# Patient Record
Sex: Female | Born: 1975 | Race: White | Hispanic: No | Marital: Single | Smoking: Never smoker
Health system: Southern US, Community
[De-identification: ages and names within clinical notes are randomized; demographics above are authoritative.]

## PROBLEM LIST (undated history)

## (undated) DIAGNOSIS — M549 Dorsalgia, unspecified: Secondary | ICD-10-CM

## (undated) DIAGNOSIS — F259 Schizoaffective disorder, unspecified: Secondary | ICD-10-CM

## (undated) DIAGNOSIS — J309 Allergic rhinitis, unspecified: Secondary | ICD-10-CM

## (undated) DIAGNOSIS — D510 Vitamin B12 deficiency anemia due to intrinsic factor deficiency: Secondary | ICD-10-CM

## (undated) HISTORY — DX: Allergic rhinitis, unspecified: J30.9

## (undated) HISTORY — DX: Schizoaffective disorder, unspecified: F25.9

## (undated) HISTORY — DX: Dorsalgia, unspecified: M54.9

## (undated) HISTORY — DX: Vitamin B12 deficiency anemia due to intrinsic factor deficiency: D51.0

---

## 2010-07-15 HISTORY — PX: CHOLECYSTECTOMY: SHX55

## 2014-05-09 ENCOUNTER — Ambulatory Visit: Payer: Self-pay | Admitting: General Practice

## 2015-03-16 ENCOUNTER — Ambulatory Visit (INDEPENDENT_AMBULATORY_CARE_PROVIDER_SITE_OTHER): Payer: PRIVATE HEALTH INSURANCE | Admitting: Obstetrics and Gynecology

## 2015-03-16 ENCOUNTER — Encounter: Payer: Self-pay | Admitting: Obstetrics and Gynecology

## 2015-03-16 VITALS — BP 127/81 | HR 77 | Ht 66.0 in | Wt 291.8 lb

## 2015-03-16 DIAGNOSIS — Z8742 Personal history of other diseases of the female genital tract: Secondary | ICD-10-CM | POA: Diagnosis not present

## 2015-03-16 DIAGNOSIS — R350 Frequency of micturition: Secondary | ICD-10-CM | POA: Diagnosis not present

## 2015-03-16 DIAGNOSIS — Z01419 Encounter for gynecological examination (general) (routine) without abnormal findings: Secondary | ICD-10-CM

## 2015-03-16 DIAGNOSIS — E669 Obesity, unspecified: Secondary | ICD-10-CM | POA: Diagnosis not present

## 2015-03-16 LAB — POCT URINALYSIS DIPSTICK
BILIRUBIN UA: NEGATIVE
Blood, UA: NEGATIVE
Glucose, UA: NEGATIVE
KETONES UA: NEGATIVE
LEUKOCYTES UA: NEGATIVE
Nitrite, UA: NEGATIVE
PH UA: 6
Protein, UA: NEGATIVE
SPEC GRAV UA: 1.01
Urobilinogen, UA: NEGATIVE

## 2015-03-16 MED ORDER — NORETHIN-ETH ESTRAD TRIPHASIC 0.5/0.75/1-35 MG-MCG PO TABS: 1.0000 | ORAL_TABLET | Freq: Every day | ORAL | Status: DC

## 2015-03-16 NOTE — Progress Notes (Signed)
Patient ID: Krystal Acosta, female   DOB: [birth year], 39 y.o.   MRN: [id number] ANNUAL PREVENTATIVE CARE GYN  ENCOUNTER NOTE  Subjective:       Krystal Acosta is a 39 y.o. G0P0000 female here for a routine annual gynecologic exam.  Current complaints: 1.  Refill pills  Needs pcp     Gynecologic History Menarche: Age 70. Cycles: Regular, monthly, lasting 5 days. History of dysmenorrhea requiring 8.  Advil a day (when not taking OCPs); no symptoms on OCPs. History of one abnormal Pap smear; follow-up Pap normal. No history of PID or STI Patient's last menstrual period was 02/28/2015 (exact date). Contraception: OCP (estrogen/progesterone) Last Pap: 2011. Results were: normal Last mammogram: 2012 Results were: normal  Obstetric History OB History  Gravida Para Term Preterm AB SAB TAB Ectopic Multiple Living  0 0 0 0 0 0 0 0 0 0         Past Medical History  Diagnosis Date  . Pernicious anemia   . AR (allergic rhinitis)   . Back pain     Past Surgical History  Procedure Laterality Date  . Cholecystectomy  2012    No current outpatient prescriptions on file prior to visit.   No current facility-administered medications on file prior to visit.    Allergies  Allergen Reactions  . Hydrocodone-Acetaminophen Itching    Social History   Social History  . Marital Status: Single    Spouse Name: N/A  . Number of Children: N/A  . Years of Education: N/A   Occupational History  . Not on file.   Social History Main Topics  . Smoking status: Never Smoker   . Smokeless tobacco: Not on file  . Alcohol Use: No  . Drug Use: No  . Sexual Activity: Yes    Birth Control/ Protection: Pill   Other Topics Concern  . Not on file   Social History Narrative  . No narrative on file    Family History  Problem Relation Age of Onset  . Diabetes Mother   . Heart disease Father   . Cancer Neg Hx     The following portions of the patient's history were reviewed and updated  as appropriate: allergies, current medications, past family history, past medical history, past social history, past surgical history and problem list.  Review of Systems ROS Review of Systems - General ROS: negative for - chills, fatigue, fever, hot flashes, night sweats, weight gain or weight loss Psychological ROS: negative for - anxiety, decreased libido, depression, mood swings, physical abuse or sexual abuse Ophthalmic ROS: negative for - blurry vision, eye pain or loss of vision ENT ROS: negative for - headaches, hearing change, visual changes or vocal changes Allergy and Immunology ROS: negative for - hives, itchy/watery eyes or seasonal allergies Hematological and Lymphatic ROS: negative for - bleeding problems, bruising, swollen lymph nodes or weight loss Endocrine ROS: negative for - galactorrhea, hair pattern changes, hot flashes, malaise/lethargy, mood swings, palpitations, polydipsia/polyuria, skin changes, temperature intolerance or unexpected weight changes Breast ROS: negative for - new or changing breast lumps or nipple discharge Respiratory ROS: negative for - cough or shortness of breath Cardiovascular ROS: negative for - chest pain, irregular heartbeat, palpitations or shortness of breath Gastrointestinal ROS: no abdominal pain, change in bowel habits, or black or bloody stools Genito-Urinary ROS: no dysuria, trouble voiding, or hematuria Musculoskeletal ROS: negative for - joint pain or joint stiffness Neurological ROS: negative for - bowel and bladder control changes Dermatological ROS: negative  for rash and skin lesion changes   Objective:   BP 127/81 mmHg  Pulse 77  Ht  (1.676 m)  Wt 291 lb 12.8 oz (132.36 kg)  BMI 47.12 kg/m2  LMP 02/28/2015 (Exact Date) CONSTITUTIONAL: Well-developed, well-nourished female in no acute distress.  PSYCHIATRIC: Normal mood and affect. Normal behavior. Normal judgment and thought content. NEUROLGIC: Alert and oriented to  person, place, and time. Normal muscle tone coordination. No cranial nerve deficit noted. HENT:  Normocephalic, atraumatic, External right and left ear normal. Oropharynx is clear and moist EYES: Conjunctivae and EOM are normal. Pupils are equal, round, and reactive to light. No scleral icterus.  NECK: Normal range of motion, supple, no masses.  Normal thyroid.  SKIN: Skin is warm and dry. No rash noted. Not diaphoretic. No erythema. No pallor. CARDIOVASCULAR: Normal heart rate noted, regular rhythm, no murmur. RESPIRATORY: Clear to auscultation bilaterally. Effort and breath sounds normal, no problems with respiration noted. BREASTS: Symmetric in size. No masses, skin changes, nipple drainage, or lymphadenopathy. ABDOMEN: Soft, normal bowel sounds, no distention noted.  No tenderness, rebound or guarding.  BLADDER: Normal PELVIC:  External Genitalia: Normal  BUS: Normal  Vagina: Normal  Cervix: Normal; No lesions  Uterus: Normal; Midplane, normal size, shape, mobile  Adnexa: Normal; Nonpalpable, nontender  RV: External Exam NormaI  MUSCULOSKELETAL: Normal range of motion. No tenderness.  No cyanosis, clubbing, or edema.  2+ distal pulses. LYMPHATIC: No Axillary, Supraclavicular, or Inguinal Adenopathy.    Assessment:   Annual gynecologic examination 39 y.o. Contraception: OCP (estrogen/progesterone) Obesity 1 Problem List Items Addressed This Visit    None      Plan:  Pap: Pap Co Test Mammogram: Not Indicated Stool Guaiac Testing:  Not Indicated Labs: empoyee clinic Routine preventative health maintenance measures emphasized: Exercise/Diet/Weight control, Tobacco Warnings and Alcohol/Substance use risks Refill OCPs, (Nortrel 777) Return to Clinic - 1 [address] [place], CMA  Herold Harms, MD

## 2015-03-20 DIAGNOSIS — F259 Schizoaffective disorder, unspecified: Secondary | ICD-10-CM | POA: Insufficient documentation

## 2015-03-20 DIAGNOSIS — G44209 Tension-type headache, unspecified, not intractable: Secondary | ICD-10-CM | POA: Insufficient documentation

## 2015-03-20 DIAGNOSIS — N6019 Diffuse cystic mastopathy of unspecified breast: Secondary | ICD-10-CM | POA: Insufficient documentation

## 2015-03-20 DIAGNOSIS — F32A Depression, unspecified: Secondary | ICD-10-CM | POA: Insufficient documentation

## 2015-03-20 DIAGNOSIS — D510 Vitamin B12 deficiency anemia due to intrinsic factor deficiency: Secondary | ICD-10-CM | POA: Insufficient documentation

## 2015-03-20 DIAGNOSIS — IMO0001 Reserved for inherently not codable concepts without codable children: Secondary | ICD-10-CM | POA: Insufficient documentation

## 2015-03-20 DIAGNOSIS — F329 Major depressive disorder, single episode, unspecified: Secondary | ICD-10-CM | POA: Insufficient documentation

## 2015-03-20 NOTE — Patient Instructions (Signed)
1.  Pap smear taken. 2.  Nortrel 7/7/7 refills. 3.  Healthy eating and exercise encouraged. 4.  Return in one year for annual exam.

## 2015-03-21 LAB — PAP IG AND HPV HIGH-RISK: PAP Smear Comment: 0

## 2015-05-16 ENCOUNTER — Encounter: Payer: Self-pay | Admitting: Physician Assistant

## 2015-05-16 ENCOUNTER — Ambulatory Visit: Payer: Self-pay | Admitting: Physician Assistant

## 2015-05-16 VITALS — BP 120/80 | HR 78 | Temp 98.7°F

## 2015-05-16 DIAGNOSIS — J209 Acute bronchitis, unspecified: Secondary | ICD-10-CM

## 2015-05-16 MED ORDER — AZITHROMYCIN 250 MG PO TABS: ORAL_TABLET | ORAL | Status: DC

## 2015-05-16 MED ORDER — ALBUTEROL SULFATE HFA 108 (90 BASE) MCG/ACT IN AERS: 2.0000 | INHALATION_SPRAY | Freq: Four times a day (QID) | RESPIRATORY_TRACT | Status: DC | PRN

## 2015-05-16 MED ORDER — HYDROCOD POLST-CPM POLST ER 10-8 MG/5ML PO SUER: 5.0000 mL | Freq: Two times a day (BID) | ORAL | Status: DC | PRN

## 2015-05-16 NOTE — Progress Notes (Signed)
S: C/o cough and congestion, chest is sore from coughing, ? fever, chills, cough is dry and hacking; keeping pt awake at night;  denies cardiac type chest pain or sob, v/d, abd pain, having night sweats for last 3 nights Remainder ros neg  O: vitals wnl, nad, tms clear, throat injected, neck supple no lymph, lungs c t a, cv rrr, neuro intact, cough is dry and hacking  A:  Acute bronchitis   P:  rx medication: zpack, albuterol inhaler, tussionex (pt has had before and did ok) ; use otc meds, tylenol or motrin as needed for fever/chills, return if not better in 3 -5 days, return earlier if worsening

## 2015-05-23 ENCOUNTER — Encounter: Payer: Self-pay | Admitting: Physician Assistant

## 2015-05-23 ENCOUNTER — Ambulatory Visit: Payer: Self-pay | Admitting: Family

## 2015-05-23 VITALS — BP 120/90 | HR 97 | Temp 99.5°F

## 2015-05-23 DIAGNOSIS — J329 Chronic sinusitis, unspecified: Secondary | ICD-10-CM

## 2015-05-23 MED ORDER — LEVOFLOXACIN 500 MG PO TABS: 500.0000 mg | ORAL_TABLET | Freq: Every day | ORAL | Status: DC

## 2015-05-23 NOTE — Progress Notes (Signed)
S/ RTC c/o persistant facial pain and pressure after completing zpack, dry cough , blowing green malaise and fever, states her sugars are normal, back sore from coughing   O/ low grade temp, mildly ill appearing , NAD dry cough ENT R tm red superiorly , nasal mucosa red, turbinates boggy with purulent d/c + frontomax tenderness , pharynx increased pnd neck supple heart rsr lungs clear A/ sinusitis P levaquin 500 mg  X 10 days one qd.supportive measures nasal saline. Tylenol 1-2 tabs po q4h prn For fever control .

## 2015-07-27 ENCOUNTER — Ambulatory Visit: Payer: Self-pay | Admitting: Physician Assistant

## 2015-07-27 ENCOUNTER — Encounter: Payer: Self-pay | Admitting: Physician Assistant

## 2015-07-27 VITALS — BP 140/80 | HR 99 | Temp 99.4°F

## 2015-07-27 DIAGNOSIS — J018 Other acute sinusitis: Secondary | ICD-10-CM

## 2015-07-27 MED ORDER — AMOXICILLIN-POT CLAVULANATE 875-125 MG PO TABS: 1.0000 | ORAL_TABLET | Freq: Two times a day (BID) | ORAL | Status: DC

## 2015-07-27 NOTE — Progress Notes (Signed)
S: c/o ear and neck pain, ? r side of neck has been sore for over a month; Fever on and off, no cough or congestion, no cp/sob; also having chronic low back and hip pain, has seen her chiropractor and pcp, pcp sent her to PT and it didn't help, doesn't want to f/u with ortho as suggested  O: vitals wnl, nad, tms dull, nasal mucosa red and swollen, throat wnl, neck supple no lymph, tender on r side under mandible but adjacent to cervical chain; lungs c t a, cv rrr, back nontender, pt has trouble standing due to "stiffness"; able to walk without a limp or foot drop  A: acute sinusitis, chronic low back and hip pain  P: augmentin 875mg  bid x 10d, will refer to Ent; told her to see pcp about her back since doesn't want a referal to ortho

## 2015-08-23 ENCOUNTER — Other Ambulatory Visit: Payer: Self-pay

## 2015-08-23 DIAGNOSIS — Z79899 Other long term (current) drug therapy: Secondary | ICD-10-CM

## 2015-08-23 DIAGNOSIS — F259 Schizoaffective disorder, unspecified: Secondary | ICD-10-CM

## 2015-08-23 NOTE — Progress Notes (Signed)
Patient in office today for labs only per Dr Wellington Hampshire drawn from right arm

## 2015-08-24 LAB — CMP12+LP+TP+TSH+6AC+CBC/D/PLT
ALK PHOS: 67 IU/L (ref 39–117)
ALT: 10 IU/L (ref 0–32)
AST: 7 IU/L (ref 0–40)
Albumin/Globulin Ratio: 1.5 (ref 1.1–2.5)
Albumin: 4.1 g/dL (ref 3.5–5.5)
BASOS: 0 %
BILIRUBIN TOTAL: 0.2 mg/dL (ref 0.0–1.2)
BUN / CREAT RATIO: 11 (ref 8–20)
BUN: 9 mg/dL (ref 6–20)
Basophils Absolute: 0 10*3/uL (ref 0.0–0.2)
CALCIUM: 9.3 mg/dL (ref 8.7–10.2)
CHLORIDE: 100 mmol/L (ref 96–106)
CHOL/HDL RATIO: 4.5 ratio — AB (ref 0.0–4.4)
CHOLESTEROL TOTAL: 199 mg/dL (ref 100–199)
Creatinine, Ser: 0.83 mg/dL (ref 0.57–1.00)
EOS (ABSOLUTE): 0.4 10*3/uL (ref 0.0–0.4)
EOS: 2 %
ESTIMATED CHD RISK: 1.1 times avg. — AB (ref 0.0–1.0)
FREE THYROXINE INDEX: 1.7 (ref 1.2–4.9)
GFR calc Af Amer: 104 mL/min/{1.73_m2} (ref >59)
GFR calc non Af Amer: 90 mL/min/{1.73_m2} (ref >59)
GGT: 14 IU/L (ref 0–60)
GLUCOSE: 81 mg/dL (ref 65–99)
Globulin, Total: 2.8 g/dL (ref 1.5–4.5)
HDL: 44 mg/dL (ref >39)
HEMATOCRIT: 39.3 % (ref 34.0–46.6)
HEMOGLOBIN: 13 g/dL (ref 11.1–15.9)
IMMATURE GRANS (ABS): 0.1 10*3/uL (ref 0.0–0.1)
IMMATURE GRANULOCYTES: 1 %
Iron: 91 ug/dL (ref 27–159)
LDH: 117 IU/L — AB (ref 119–226)
LDL CALC: 108 mg/dL — AB (ref 0–99)
LYMPHS ABS: 4.8 10*3/uL — AB (ref 0.7–3.1)
Lymphs: 26 %
MCH: 28.4 pg (ref 26.6–33.0)
MCHC: 33.1 g/dL (ref 31.5–35.7)
MCV: 86 fL (ref 79–97)
Monocytes Absolute: 1.1 10*3/uL — ABNORMAL HIGH (ref 0.1–0.9)
Monocytes: 6 %
NEUTROS ABS: 12 10*3/uL — AB (ref 1.4–7.0)
NEUTROS PCT: 65 %
Phosphorus: 3.4 mg/dL (ref 2.5–4.5)
Platelets: 328 10*3/uL (ref 150–379)
Potassium: 4.7 mmol/L (ref 3.5–5.2)
RBC: 4.58 x10E6/uL (ref 3.77–5.28)
RDW: 13.9 % (ref 12.3–15.4)
SODIUM: 139 mmol/L (ref 134–144)
T3 Uptake Ratio: 24 % (ref 24–39)
T4, Total: 7 ug/dL (ref 4.5–12.0)
TSH: 4.81 u[IU]/mL — ABNORMAL HIGH (ref 0.450–4.500)
Total Protein: 6.9 g/dL (ref 6.0–8.5)
Triglycerides: 235 mg/dL — ABNORMAL HIGH (ref 0–149)
URIC ACID: 4.3 mg/dL (ref 2.5–7.1)
VLDL CHOLESTEROL CAL: 47 mg/dL — AB (ref 5–40)
WBC: 18.5 10*3/uL — ABNORMAL HIGH (ref 3.4–10.8)

## 2015-08-24 LAB — LITHIUM LEVEL: Lithium Lvl: 0.6 mmol/L (ref 0.6–1.4)

## 2015-08-24 LAB — HGB A1C W/O EAG: HEMOGLOBIN A1C: 5.7 % — AB (ref 4.8–5.6)

## 2015-08-24 NOTE — Progress Notes (Signed)
Lab results was faxed to Dr. Quintella Reichert at [facility] per patient's request.

## 2015-11-08 ENCOUNTER — Ambulatory Visit: Payer: Self-pay | Admitting: Physician Assistant

## 2015-11-08 ENCOUNTER — Encounter: Payer: Self-pay | Admitting: Physician Assistant

## 2015-11-08 DIAGNOSIS — R21 Rash and other nonspecific skin eruption: Secondary | ICD-10-CM

## 2015-11-08 DIAGNOSIS — Z299 Encounter for prophylactic measures, unspecified: Secondary | ICD-10-CM

## 2015-11-08 NOTE — Progress Notes (Signed)
S: needs referal for rash on face, skin texture has changed, is rough and dry, darkened, ? If from birth control pills  O: skin with red area on cheeks and brown area typical of hormonal rash, no pustules or drainage, n/v intact  A: rash  P: refer to dermatology

## 2015-11-08 NOTE — Addendum Note (Signed)
Addended by: Catha BrowEACON, MONIQUE T on: 11/08/2015 10:14 AM   Modules accepted: Orders

## 2015-11-11 LAB — ALLERGEN PANEL (27) + IGE
Aspergillus Fumigatus IgE: <0.1 kU/L
Bahia Grass IgE: <0.1 kU/L
Bermuda Grass IgE: <0.1 kU/L
Cat Dander IgE: 0.2 kU/L — AB
Cedar, Mountain IgE: <0.1 kU/L
Cladosporium Herbarum IgE: <0.1 kU/L
Common Silver Birch IgE: <0.1 kU/L
D Farinae IgE: <0.1 kU/L
Dog Dander IgE: <0.1 kU/L
Elm, American IgE: <0.1 kU/L
Hickory, White IgE: <0.1 kU/L
IGE (IMMUNOGLOBULIN E), SERUM: 17 [IU]/mL (ref 0–100)
Johnson Grass IgE: <0.1 kU/L
Kentucky Bluegrass IgE: <0.1 kU/L
Maple/Box Elder IgE: <0.1 kU/L
Penicillium Chrysogen IgE: <0.1 kU/L
Timothy Grass IgE: <0.1 kU/L
White Mulberry IgE: <0.1 kU/L

## 2015-11-11 LAB — THYROID PANEL WITH TSH
FREE THYROXINE INDEX: 1.5 (ref 1.2–4.9)
T3 UPTAKE RATIO: 25 % (ref 24–39)
T4, Total: 5.8 ug/dL (ref 4.5–12.0)
TSH: 2.95 u[IU]/mL (ref 0.450–4.500)

## 2015-11-11 LAB — ANA W/REFLEX IF POSITIVE: Anti Nuclear Antibody(ANA): NEGATIVE

## 2015-11-11 LAB — LITHIUM LEVEL: LITHIUM LVL: 0.8 mmol/L (ref 0.6–1.2)

## 2015-11-11 LAB — VITAMIN B12: Vitamin B-12: 670 pg/mL (ref 211–946)

## 2015-11-17 ENCOUNTER — Encounter: Payer: Self-pay | Admitting: Physician Assistant

## 2015-11-17 ENCOUNTER — Ambulatory Visit: Payer: Self-pay | Admitting: Physician Assistant

## 2015-11-17 VITALS — BP 117/70 | HR 85 | Temp 99.3°F

## 2015-11-17 DIAGNOSIS — J0181 Other acute recurrent sinusitis: Secondary | ICD-10-CM

## 2015-11-17 MED ORDER — SULFAMETHOXAZOLE-TRIMETHOPRIM 800-160 MG PO TABS: 1.0000 | ORAL_TABLET | Freq: Two times a day (BID) | ORAL | Status: DC

## 2015-11-17 NOTE — Progress Notes (Signed)
S: C/o sore throat and sinus drainage for 3 days, no fever, chills, cp/sob, v/d;  Was seen by ENT after sinus infection in January and took about 4 weeks of antibiotics, finally got cleared up, was told ct showed a lot of packed infection  Using otc meds:   O: PE: perrl eomi, normocephalic, tms dull, nasal mucosa red and swollen, throat injected, neck supple no lymph, lungs c t a, cv rrr, neuro intact  A:  Acute sinusitis, recurrent   P: drink fluids, continue regular meds , use otc meds of choice, return if not improving in 5 days, return earlier if worsening ; septra ds 1 po bid, f/u with ENT

## 2016-01-26 ENCOUNTER — Ambulatory Visit: Payer: Self-pay | Admitting: Physician Assistant

## 2016-01-26 ENCOUNTER — Encounter: Payer: Self-pay | Admitting: Physician Assistant

## 2016-01-26 VITALS — BP 129/80 | HR 80 | Temp 99.0°F

## 2016-01-26 DIAGNOSIS — IMO0001 Reserved for inherently not codable concepts without codable children: Secondary | ICD-10-CM

## 2016-01-26 MED ORDER — KETOROLAC TROMETHAMINE 60 MG/2ML IM SOLN
60.0000 mg | Freq: Once | INTRAMUSCULAR | Status: AC
  Administered 2016-01-26: 60 mg via INTRAMUSCULAR

## 2016-01-26 MED ORDER — NAPROXEN 500 MG PO TABS: 500.0000 mg | ORAL_TABLET | Freq: Two times a day (BID) | ORAL | Status: DC

## 2016-01-26 MED ORDER — METHOCARBAMOL 750 MG PO TABS: 750.0000 mg | ORAL_TABLET | Freq: Four times a day (QID) | ORAL | Status: DC

## 2016-01-26 NOTE — Progress Notes (Signed)
   Subjective:Right hip pain    Patient ID: Nyoka LintChristy Darcey, female    DOB: May 06, 1976, 40 y.o.   MRN: [id number]  HPI  Patient c/o one month of right hip pain 2nd to martial art training. Denies radicular pain from this compliant. No relief with conservative treatment. States pain increase with attemping to stand after prolong sitting. Denies bladder or bowel dysfunction.   Review of Systems    DM,depression, schizo-affective disorder and amenia. Objective:   Physical Exam Morbid obesity. Normal gait. No leg length discrepancy. Increase guarding with hip flexion and abduction.       Assessment & Plan: Right hip strain  Five day trail of Tramadol, Robaxin, and Naproxen.Decreased physical training for 10 days. Follow up 5 days if no improvement.

## 2016-03-08 ENCOUNTER — Ambulatory Visit: Payer: Self-pay | Admitting: Physician Assistant

## 2016-03-08 DIAGNOSIS — F329 Major depressive disorder, single episode, unspecified: Secondary | ICD-10-CM

## 2016-03-08 DIAGNOSIS — F32A Depression, unspecified: Secondary | ICD-10-CM

## 2016-03-08 DIAGNOSIS — R5383 Other fatigue: Principal | ICD-10-CM

## 2016-03-08 MED ORDER — METHOCARBAMOL 750 MG PO TABS: 750.0000 mg | ORAL_TABLET | Freq: Four times a day (QID) | ORAL | 0 refills | Status: DC

## 2016-03-08 NOTE — Progress Notes (Signed)
   Subjective:    Patient ID: Krystal Acosta, female    DOB: 1975-08-14, 40 y.o.   MRN: [id number]  HPI Patient c/o  hair loss, neck pain, and ear ache for one week. Also c/o chronic fatigue and has notice hair loss from scalp. Patient states was on Synthroid but was discontinue by treating Doctor 4 months ago. States neck pain is left lateral and increase with right later motions of neck. Denies radicular pain. And Depression..   Review of Systems  Depression Objective:   Physical Exam No acute distress. HEENT unremarkable. Neck supple with left Nuchal muscle spasms. Lungs CTA and Heart RRR.       Assessment & Plan:Cervical Strain/Fatigue  TSH and T4 with follow up in 3 days. Robaxin for cervical strain.

## 2016-03-09 LAB — TSH+FREE T4
FREE T4: 0.78 ng/dL — AB (ref 0.82–1.77)
TSH: 4.17 u[IU]/mL (ref 0.450–4.500)

## 2016-03-11 ENCOUNTER — Ambulatory Visit: Payer: Self-pay | Admitting: Physician Assistant

## 2016-03-11 ENCOUNTER — Encounter: Payer: Self-pay | Admitting: Physician Assistant

## 2016-03-11 VITALS — BP 130/82 | HR 88 | Temp 99.4°F

## 2016-03-11 DIAGNOSIS — M26621 Arthralgia of right temporomandibular joint: Secondary | ICD-10-CM

## 2016-03-11 DIAGNOSIS — L659 Nonscarring hair loss, unspecified: Secondary | ICD-10-CM

## 2016-03-11 NOTE — Progress Notes (Signed)
S: here for recheck and lab results, states her hair is thinning on the top of her head, depression is worsening but has appointment with her psychiatrist on Wed, also her r jaw is sore and hurts to open, no fever, chills  O: vitals wnl, nad, scalp with a small area of thinning hair on top, r tmj is tender, pt is able to open and close her mouth, lungs c t a, cv rrr  A: tmj, depression  P: tsh lab printed off for psychiatry, pt to f/u on Wed, is not suicidal, continue counseling at [facility]

## 2016-03-15 NOTE — Progress Notes (Signed)
ANNUAL PREVENTATIVE CARE GYN  ENCOUNTER NOTE  Subjective:       Krystal Acosta is a 40 y.o. G0P0000 female here for a routine annual gynecologic exam.  Current complaints: 1.   Irregular bleeding on ocp   Hot flashes   Thinning hair  Patient has gained weight this year. Activity is diminished since she has an injury to her right hip doing martial arts exercises. 11/08/2015 lab work is notable for a normal thyroid profile including TSH of 2.95, total T4 of 5.8, FTI 1.5. Antenatal testing was negative. B-12 level was normal as well as a lithium level Lab work from 3 days ago demonstrated a borderline TSH of 4.85 and a slightly low T4; she was prescribed Synthroid 25 g a day and is to have follow-up with her primary care.  Patient is also complaining of some left axillary discomfort for the past several weeks. No recent history of trauma.  Menstrual cycles have been irregular the past 2 months with 2 weeks of bleeding of dark burgundy blood and slight increased cramping. She does have history of severe dysmenorrhea prior to start OCP therapy.     Gynecologic History Menarche: Age 85. Cycles: Regular, monthly, lasting 5 days. History of dysmenorrhea requiring 8.  Advil a day (when not taking OCPs); no symptoms on OCPs. History of one abnormal Pap smear; follow-up Pap normal. No history of PID or STI Patient's last menstrual period was 02/28/2015 (exact date). Contraception: OCP (estrogen/progesterone) Last Pap:  03/16/2015 negative/negative Last mammogram: 2012 Results were: normal  Obstetric History OB History  Gravida Para Term Preterm AB Living  0 0 0 0 0 0  SAB TAB Ectopic Multiple Live Births  0 0 0 0          Past Medical History:  Diagnosis Date  . AR (allergic rhinitis)   . Back pain   . Pernicious anemia   . Schizoaffective disorder [facility])     Past Surgical History:  Procedure Laterality Date  . CHOLECYSTECTOMY  2012    Current Outpatient Prescriptions on  File Prior to Visit  Medication Sig Dispense Refill  . albuterol (PROVENTIL HFA;VENTOLIN HFA) 108 (90 BASE) MCG/ACT inhaler Inhale 2 puffs into the lungs every 6 (six) hours as needed for wheezing or shortness of breath. 1 Inhaler 0  . cyanocobalamin (,VITAMIN B-12,) 1000 MCG/ML injection Inject as directed. monthly    . fluticasone (FLONASE) 50 MCG/ACT nasal spray 2 SPRAYS EACH NOSTRIL EVERY DAY  0  . IRON PO Take by mouth.    . lithium carbonate (LITHOBID) 300 MG CR tablet Take by mouth.    . metFORMIN (GLUCOPHAGE) 1000 MG tablet Take 1,000 mg by mouth.    . methocarbamol (ROBAXIN-750) 750 MG tablet Take 1 tablet (750 mg total) by mouth 4 (four) times daily. (Patient not taking: Reported on 03/11/2016) 20 tablet 0  . methocarbamol (ROBAXIN-750) 750 MG tablet Take 1 tablet (750 mg total) by mouth 4 (four) times daily. 20 tablet 0  . naproxen (NAPROSYN) 500 MG tablet Take 1 tablet (500 mg total) by mouth 2 (two) times daily with a meal. 20 tablet 00  . norethindrone-ethinyl estradiol (NORTREL 7/7/7) 0.5/0.75/1-35 MG-MCG tablet Take 1 tablet by mouth daily. 3 Package 4  . QUEtiapine (SEROQUEL) 200 MG tablet Take 400 mg by mouth.     . sertraline (ZOLOFT) 50 MG tablet Take 50 mg by mouth.     No current facility-administered medications on file prior to visit.     Allergies  Allergen Reactions  . Hydrocodone-Acetaminophen Itching    Social History   Social History  . Marital status: Single    Spouse name: N/A  . Number of children: N/A  . Years of education: N/A   Occupational History  . Not on file.   Social History Main Topics  . Smoking status: Never Smoker  . Smokeless tobacco: Not on file  . Alcohol use No  . Drug use: No  . Sexual activity: Yes    Birth control/ protection: Pill   Other Topics Concern  . Not on file   Social History Narrative  . No narrative on file    Family History  Problem Relation Age of Onset  . Diabetes Mother   . Heart disease Father   .  Cancer Neg Hx     The following portions of the patient's history were reviewed and updated as appropriate: allergies, current medications, past family history, past medical history, past social history, past surgical history and problem list.  Review of Systems ROS Review of Systems - General ROS: negative for - chills, fatigue, fever, hot flashes, night sweats, weight gain or weight loss Psychological ROS: negative for - anxiety, decreased libido, depression, mood swings, physical abuse or sexual abuse Ophthalmic ROS: negative for - blurry vision, eye pain or loss of vision ENT ROS: negative for - headaches, hearing change, visual changes or vocal changes Allergy and Immunology ROS: negative for - hives, itchy/watery eyes or seasonal allergies Hematological and Lymphatic ROS: negative for - bleeding problems, bruising, swollen lymph nodes or weight loss Endocrine ROS: negative for - galactorrhea, hair pattern changes, hot flashes, malaise/lethargy, mood swings, palpitations, polydipsia/polyuria, skin changes, temperature intolerance or unexpected weight changes Breast ROS: negative for - new or changing breast lumps or nipple discharge Respiratory ROS: negative for - cough or shortness of breath Cardiovascular ROS: negative for - chest pain, irregular heartbeat, palpitations or shortness of breath Gastrointestinal ROS: no abdominal pain, change in bowel habits, or black or bloody stools Genito-Urinary ROS: no dysuria, trouble voiding, or hematuria Musculoskeletal ROS: negative for - joint pain or joint stiffness; no axillary abnormalities identified on palpation Neurological ROS: negative for - bowel and bladder control changes Dermatological ROS: negative for rash and skin lesion changes   Objective:   BP 121/85   Pulse 85   Ht 5\' 6"  (1.676 m)   Wt (!) 301 lb 8 oz (136.8 kg)   LMP 03/11/2016 (Exact Date)   BMI 48.66 kg/m  CONSTITUTIONAL: Well-developed, well-nourished female in no  acute distress.  PSYCHIATRIC: Normal mood and affect. Normal behavior. Normal judgment and thought content. NEUROLGIC: Alert and oriented to person, place, and time. Normal muscle tone coordination. No cranial nerve deficit noted. HENT:  Normocephalic, atraumatic, External right and left ear normal.  EYES: Conjunctivae and EOM are normal.No scleral icterus.  NECK: Normal range of motion, supple, no masses.  Normal thyroid.  SKIN: Skin is warm and dry. No rash noted. Not diaphoretic. No erythema. No pallor. CARDIOVASCULAR: Normal heart rate noted, regular rhythm, no murmur. RESPIRATORY: Clear to auscultation bilaterally. Effort and breath sounds normal, no problems with respiration noted. BREASTS: Symmetric in size. No masses, skin changes, nipple drainage, or lymphadenopathy. ABDOMEN: Soft, normal bowel sounds, no distention noted.  No tenderness, rebound or guarding.  BLADDER: Normal PELVIC:                       External Genitalia: Normal  BUS: Normal                       Vagina: Normal; burgundy menstrual blood in vault                       Cervix: Normal; No lesions; no significant cervical motion tenderness                       Uterus: Normal; Midplane, normal size, shape, mobile, nontender                       Adnexa: Normal; Nonpalpable, nontender                       RV: External Exam NormaI  MUSCULOSKELETAL: Normal range of motion. No tenderness.  No cyanosis, clubbing, or edema.  2+ distal pulses. LYMPHATIC: No Axillary, Supraclavicular, or Inguinal Adenopathy.    Assessment:   Annual gynecologic examination 40 y.o. Contraception: OCP (estrogen/progesterone) Nortrel 7/7/7  Obesity 2 increased weight this year Problem List Items Addressed This Visit    None    Visit Diagnoses    Well woman exam with routine gynecological exam    -  Primary   History of dysmenorrhea         Recent history of abnormal uterine bleeding for 2 weeks while on OCPs  associated with increased cramping. Right axilla-musculoskeletal pain without palpable abnormality Hypothyroidism, recently started on Synthroid 25 g a day Plan:  Pap: due 2019 Mammogram: Not Indicated Stool Guaiac Testing:  Not Indicated Labs: thur pcp Routine preventative health maintenance measures emphasized: Exercise/Diet/Weight control, Tobacco Warnings and Alcohol/Substance use risks  Refill birth control pills Pelvic ultrasound-1 week Endometrial biopsy-2 weeks Return to Clinic - 1 [address] [place], [place] Herold Harms, MD   Note: This dictation was prepared with Dragon dictation along with smaller phrase technology. Any transcriptional errors that result from this process are unintentional.   Note: This dictation was prepared with Dragon dictation along with smaller phrase technology. Any transcriptional errors that result from this process are unintentional.

## 2016-03-20 ENCOUNTER — Encounter: Payer: Self-pay | Admitting: Obstetrics and Gynecology

## 2016-03-20 ENCOUNTER — Ambulatory Visit (INDEPENDENT_AMBULATORY_CARE_PROVIDER_SITE_OTHER): Payer: Managed Care, Other (non HMO) | Admitting: Obstetrics and Gynecology

## 2016-03-20 VITALS — BP 121/85 | HR 85 | Ht 66.0 in | Wt 301.5 lb

## 2016-03-20 DIAGNOSIS — E669 Obesity, unspecified: Secondary | ICD-10-CM | POA: Diagnosis not present

## 2016-03-20 DIAGNOSIS — E039 Hypothyroidism, unspecified: Secondary | ICD-10-CM

## 2016-03-20 DIAGNOSIS — N939 Abnormal uterine and vaginal bleeding, unspecified: Secondary | ICD-10-CM | POA: Diagnosis not present

## 2016-03-20 DIAGNOSIS — Z01419 Encounter for gynecological examination (general) (routine) without abnormal findings: Secondary | ICD-10-CM

## 2016-03-20 DIAGNOSIS — Z8742 Personal history of other diseases of the female genital tract: Secondary | ICD-10-CM | POA: Diagnosis not present

## 2016-03-20 MED ORDER — NORETHIN-ETH ESTRAD TRIPHASIC 0.5/0.75/1-35 MG-MCG PO TABS: 1.0000 | ORAL_TABLET | Freq: Every day | ORAL | 4 refills | Status: DC

## 2016-03-20 NOTE — Patient Instructions (Addendum)
1. No Pap smear. 2. Breast self awareness encouraged 3. Healthy eating and exercise with weight loss is recommended 4. Contraception-birth control pills refilled 5. Return in 1 year for annual exam 6. Screening labs to be done through primary care 7. Ultrasound in one week 8. Endometrial biopsy in 2 weeks 9. Take 800 mg of Advil prior to endometrial biopsy  Endometrial Biopsy Endometrial biopsy is a procedure in which a tissue sample is taken from inside the uterus. The tissue sample is then looked at under a microscope to see if the tissue is normal or abnormal. The endometrium is the lining of the uterus. This procedure helps determine where you are in your menstrual cycle and how hormone levels are affecting the lining of the uterus. This procedure may also be used to evaluate uterine bleeding or to diagnose endometrial cancer, tuberculosis, polyps, or inflammatory conditions.  LET [facility] PROVIDER KNOW ABOUT:  Any allergies you have.  All medicines you are taking, including vitamins, herbs, eye drops, creams, and over-the-counter medicines.  Previous problems you or members of your family have had with the use of anesthetics.  Any blood disorders you have.  Previous surgeries you have had.  Medical conditions you have.  Possibility of pregnancy. RISKS AND COMPLICATIONS Generally, this is a safe procedure. However, as with any procedure, complications can occur. Possible complications include:  Bleeding.  Pelvic infection.  Puncture of the uterine wall with the biopsy device (rare). BEFORE THE PROCEDURE   Keep a record of your menstrual cycles as directed by your health care provider. You may need to schedule your procedure for a specific time in your cycle.  You may want to bring a sanitary pad to wear home after the procedure.  Arrange for someone to drive you home after the procedure if you will be given a medicine to help you relax (sedative). PROCEDURE    You may be given a sedative to relax you.  You will lie on an exam table with your feet and legs supported as in a pelvic exam.  Your health care provider will insert an instrument (speculum) into your vagina to see your cervix.  Your cervix will be cleansed with an antiseptic solution. A medicine (local anesthetic) will be used to numb the cervix.  A forceps instrument (tenaculum) will be used to hold your cervix steady for the biopsy.  A thin, rodlike instrument (uterine sound) will be inserted through your cervix to determine the length of your uterus and the location where the biopsy sample will be removed.  A thin, flexible tube (catheter) will be inserted through your cervix and into the uterus. The catheter is used to collect the biopsy sample from your endometrial tissue.  The catheter and speculum will then be removed, and the tissue sample will be sent to a lab for examination. AFTER THE PROCEDURE  You will rest in a recovery area until you are ready to go home.  You may have mild cramping and a small amount of vaginal bleeding for a few days after the procedure. This is normal.  Make sure you find out how to get your test results.   This information is not intended to replace advice given to you by your health care provider. Make sure you discuss any questions you have with your health care provider.   Document Released: 11/01/2004 Document Revised: 03/03/2013 Document Reviewed: 12/16/2012 Elsevier Interactive Patient Education [employer]2016 [employer]

## 2016-03-27 ENCOUNTER — Ambulatory Visit (INDEPENDENT_AMBULATORY_CARE_PROVIDER_SITE_OTHER): Payer: Managed Care, Other (non HMO)

## 2016-03-27 DIAGNOSIS — N939 Abnormal uterine and vaginal bleeding, unspecified: Secondary | ICD-10-CM

## 2016-04-02 ENCOUNTER — Encounter: Payer: Self-pay | Admitting: Obstetrics and Gynecology

## 2016-04-03 ENCOUNTER — Ambulatory Visit: Payer: Self-pay | Admitting: Physician Assistant

## 2016-04-03 ENCOUNTER — Other Ambulatory Visit: Payer: Self-pay

## 2016-04-03 DIAGNOSIS — Z299 Encounter for prophylactic measures, unspecified: Secondary | ICD-10-CM

## 2016-04-03 NOTE — Progress Notes (Signed)
Patient came in to have blood drawn for testing per Dr. Chris Aikens orders. 

## 2016-04-04 LAB — CMP12+LP+TP+TSH+6AC+CBC/D/PLT
A/G RATIO: 1.5 (ref 1.2–2.2)
ALK PHOS: 65 IU/L (ref 39–117)
ALT: 10 IU/L (ref 0–32)
AST: 12 IU/L (ref 0–40)
Albumin: 4.3 g/dL (ref 3.5–5.5)
BASOS ABS: 0 10*3/uL (ref 0.0–0.2)
BASOS: 0 %
BUN / CREAT RATIO: 14 (ref 9–23)
BUN: 11 mg/dL (ref 6–20)
CHLORIDE: 103 mmol/L (ref 96–106)
CHOL/HDL RATIO: 3.7 ratio (ref 0.0–4.4)
CREATININE: 0.79 mg/dL (ref 0.57–1.00)
Calcium: 9.4 mg/dL (ref 8.7–10.2)
Cholesterol, Total: 168 mg/dL (ref 100–199)
EOS (ABSOLUTE): 0.2 10*3/uL (ref 0.0–0.4)
EOS: 2 %
ESTIMATED CHD RISK: 0.7 times avg. (ref 0.0–1.0)
Free Thyroxine Index: 1.2 (ref 1.2–4.9)
GFR, EST AFRICAN AMERICAN: 110 mL/min/{1.73_m2} (ref >59)
GFR, EST NON AFRICAN AMERICAN: 95 mL/min/{1.73_m2} (ref >59)
GGT: 11 IU/L (ref 0–60)
GLUCOSE: 93 mg/dL (ref 65–99)
Globulin, Total: 2.8 g/dL (ref 1.5–4.5)
HDL: 45 mg/dL (ref >39)
HEMATOCRIT: 35.7 % (ref 34.0–46.6)
HEMOGLOBIN: 11.5 g/dL (ref 11.1–15.9)
IMMATURE GRANULOCYTES: 0 %
IRON: 47 ug/dL (ref 27–159)
Immature Grans (Abs): 0 10*3/uL (ref 0.0–0.1)
LDH: 115 IU/L — AB (ref 119–226)
LDL CALC: 89 mg/dL (ref 0–99)
Lymphocytes Absolute: 2.3 10*3/uL (ref 0.7–3.1)
Lymphs: 20 %
MCH: 27.8 pg (ref 26.6–33.0)
MCHC: 32.2 g/dL (ref 31.5–35.7)
MCV: 86 fL (ref 79–97)
MONOCYTES: 6 %
Monocytes Absolute: 0.6 10*3/uL (ref 0.1–0.9)
Neutrophils Absolute: 8.1 10*3/uL — ABNORMAL HIGH (ref 1.4–7.0)
Neutrophils: 72 %
PLATELETS: 323 10*3/uL (ref 150–379)
Phosphorus: 3.7 mg/dL (ref 2.5–4.5)
Potassium: 4.9 mmol/L (ref 3.5–5.2)
RBC: 4.14 x10E6/uL (ref 3.77–5.28)
RDW: 13.7 % (ref 12.3–15.4)
SODIUM: 140 mmol/L (ref 134–144)
T3 UPTAKE RATIO: 19 % — AB (ref 24–39)
T4, Total: 6.2 ug/dL (ref 4.5–12.0)
TOTAL PROTEIN: 7.1 g/dL (ref 6.0–8.5)
TSH: 4.75 u[IU]/mL — ABNORMAL HIGH (ref 0.450–4.500)
Triglycerides: 172 mg/dL — ABNORMAL HIGH (ref 0–149)
URIC ACID: 4.2 mg/dL (ref 2.5–7.1)
VLDL CHOLESTEROL CAL: 34 mg/dL (ref 5–40)
WBC: 11.2 10*3/uL — ABNORMAL HIGH (ref 3.4–10.8)

## 2016-04-04 LAB — B12 AND FOLATE PANEL
Folate: 9.5 ng/mL (ref >3.0)
Vitamin B-12: 627 pg/mL (ref 211–946)

## 2016-04-04 LAB — HGB A1C W/O EAG: HEMOGLOBIN A1C: 5.1 % (ref 4.8–5.6)

## 2016-04-04 LAB — VITAMIN D 25 HYDROXY (VIT D DEFICIENCY, FRACTURES): Vit D, 25-Hydroxy: 19.9 ng/mL — ABNORMAL LOW (ref 30.0–100.0)

## 2016-04-04 LAB — LITHIUM LEVEL: LITHIUM LVL: 0.6 mmol/L (ref 0.6–1.2)

## 2016-04-05 ENCOUNTER — Other Ambulatory Visit: Payer: Self-pay | Admitting: Obstetrics and Gynecology

## 2016-04-17 ENCOUNTER — Ambulatory Visit (INDEPENDENT_AMBULATORY_CARE_PROVIDER_SITE_OTHER): Payer: Managed Care, Other (non HMO) | Admitting: Obstetrics and Gynecology

## 2016-04-17 VITALS — BP 134/84 | HR 86 | Ht 66.0 in | Wt 308.2 lb

## 2016-04-17 DIAGNOSIS — E669 Obesity, unspecified: Secondary | ICD-10-CM

## 2016-04-17 DIAGNOSIS — N939 Abnormal uterine and vaginal bleeding, unspecified: Secondary | ICD-10-CM | POA: Diagnosis not present

## 2016-04-17 DIAGNOSIS — IMO0001 Reserved for inherently not codable concepts without codable children: Secondary | ICD-10-CM

## 2016-04-17 DIAGNOSIS — Z6841 Body Mass Index (BMI) 40.0 and over, adult: Secondary | ICD-10-CM

## 2016-04-17 NOTE — Patient Instructions (Signed)
1. Maintain menstrual calendar monitoring over the next 3 months 2. Return in 3 months for follow-up 3. Results for endometrial biopsy will be given to patient when available through my chart

## 2016-04-17 NOTE — Progress Notes (Signed)
Chief complaint: 1. Abnormal uterine bleeding on OCPs 2. Morbid obesity 3. Endometrial biopsy  03/27/2016 ultrasound demonstrates a normal pelvic ultrasound; endometrial stripe measures 2.1 mm. Because of the abnormal uterine bleeding on OCPs, endometrial biopsy is to be completed.  OBJECTIVE: BP 134/84   Pulse 86   Ht 5\' 6"  (1.676 m)   Wt (!) 308 lb 3.2 oz (139.8 kg)   LMP 04/16/2016 (Exact Date)   BMI 49.74 kg/m  Pelvic exam: External genitalia normal BUS-normal Vagina-normal Cervix-no lesions Uterus-midplane, normal size, nontender Adnexa-nonpalpable nontender  PROCEDURE: Endometrial biopsy  Endometrial Biopsy Procedure Note  Pre-operative Diagnosis: Abnormal uterine bleeding on birth control pills  Post-operative Diagnosis: Same as above  Procedure Details   Urine pregnancy test was done  and result was negative.  The risks (including infection, bleeding, pain, and uterine perforation) and benefits of the procedure were explained to the patient and Verbal informed consent was obtained.  Antibiotic prophylaxis against endocarditis was not indicated.   The patient was placed in the dorsal lithotomy position.  Bimanual exam showed the uterus to be in the neutral position.  A Graves' speculum inserted in the vagina. Endocervical curettage with a Kevorkian curette was not performed.   A sharp tenaculum was applied to the anterior lip of the cervix for stabilization.  A sterile uterine sound was used to sound the uterus to a depth of 8cm.  A Mylex 3mm curette was used to sample the endometrium.  Sample was sent for pathologic examination.  Condition: Stable  Complications: None  Plan:  The patient was advised to call for any fever or for prolonged or severe pain or bleeding. She was advised to use OTC acetaminophen and OTC ibuprofen as needed for mild to moderate pain. She was advised to avoid vaginal intercourse for 48 hours or until the bleeding has completely  stopped.  Attending Physician Documentation: Herold HarmsMartin A Aubryn Spinola, MD   ASSESSMENT: 1. Abnormal uterine bleeding on birth control pills 2. Morbid obesity 3. Normal pelvic ultrasound  PLAN: 1. Endometrial biopsy-results to be given through my chart 2. Maintain menstrual calendar monitoring 3. Return in 3 months for follow-up  Herold HarmsMartin A Rajni Holsworth, MD  Note: This dictation was prepared with Dragon dictation along with smaller phrase technology. Any transcriptional errors that result from this process are unintentional.

## 2016-04-19 LAB — PATHOLOGY

## 2016-04-22 IMAGING — CR DG CHEST 2V
1 series · 2 of 2 positions shown · non-contrast
Comparison: None.

CLINICAL DATA: Cough, chest pain, loss of appetite

EXAM:
CHEST  2 VIEW

[Series 1: kdxr chest pa (or ap) and lat · 0.14mm/px · 2 of 2 slices shown]
[im 1/2]
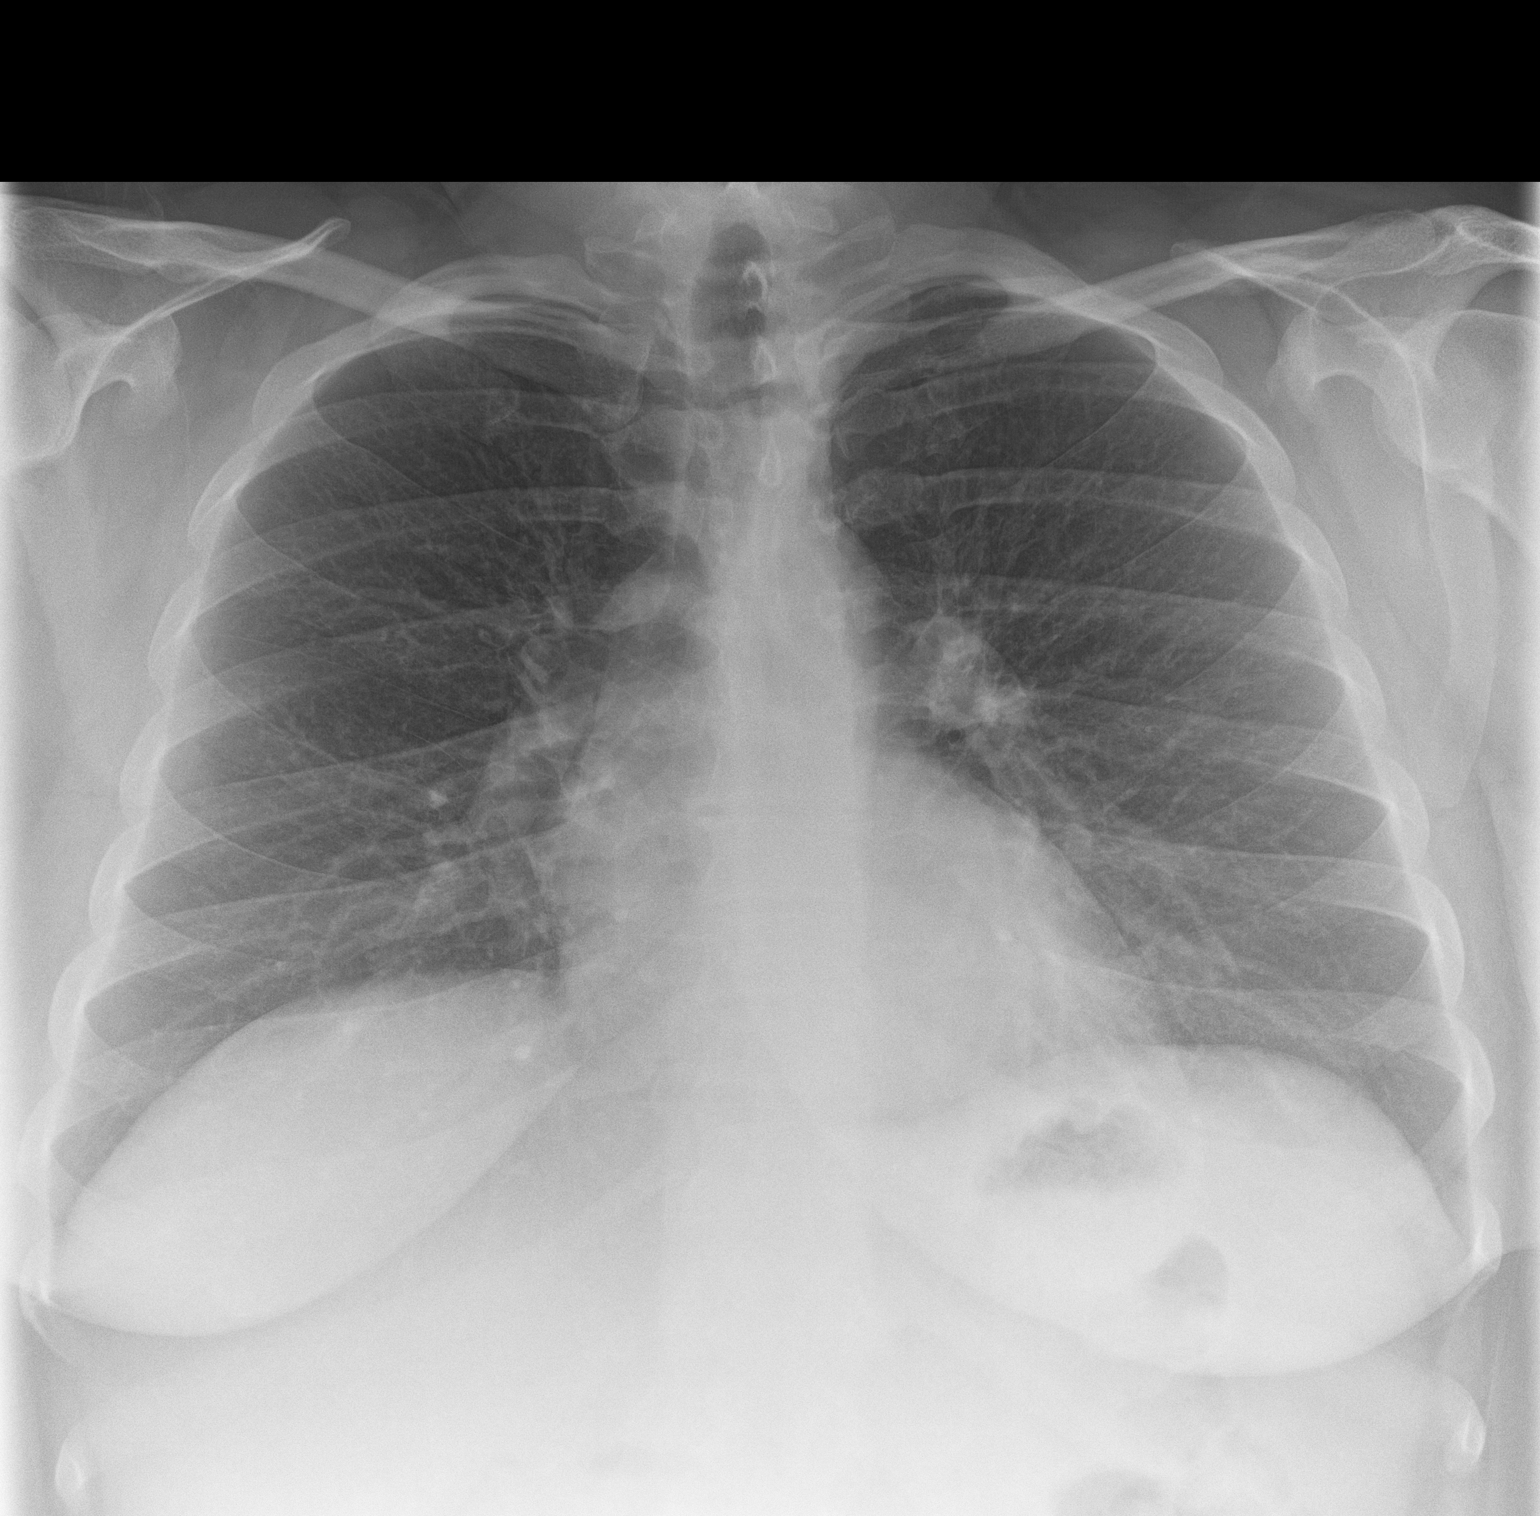
[im 2/2]
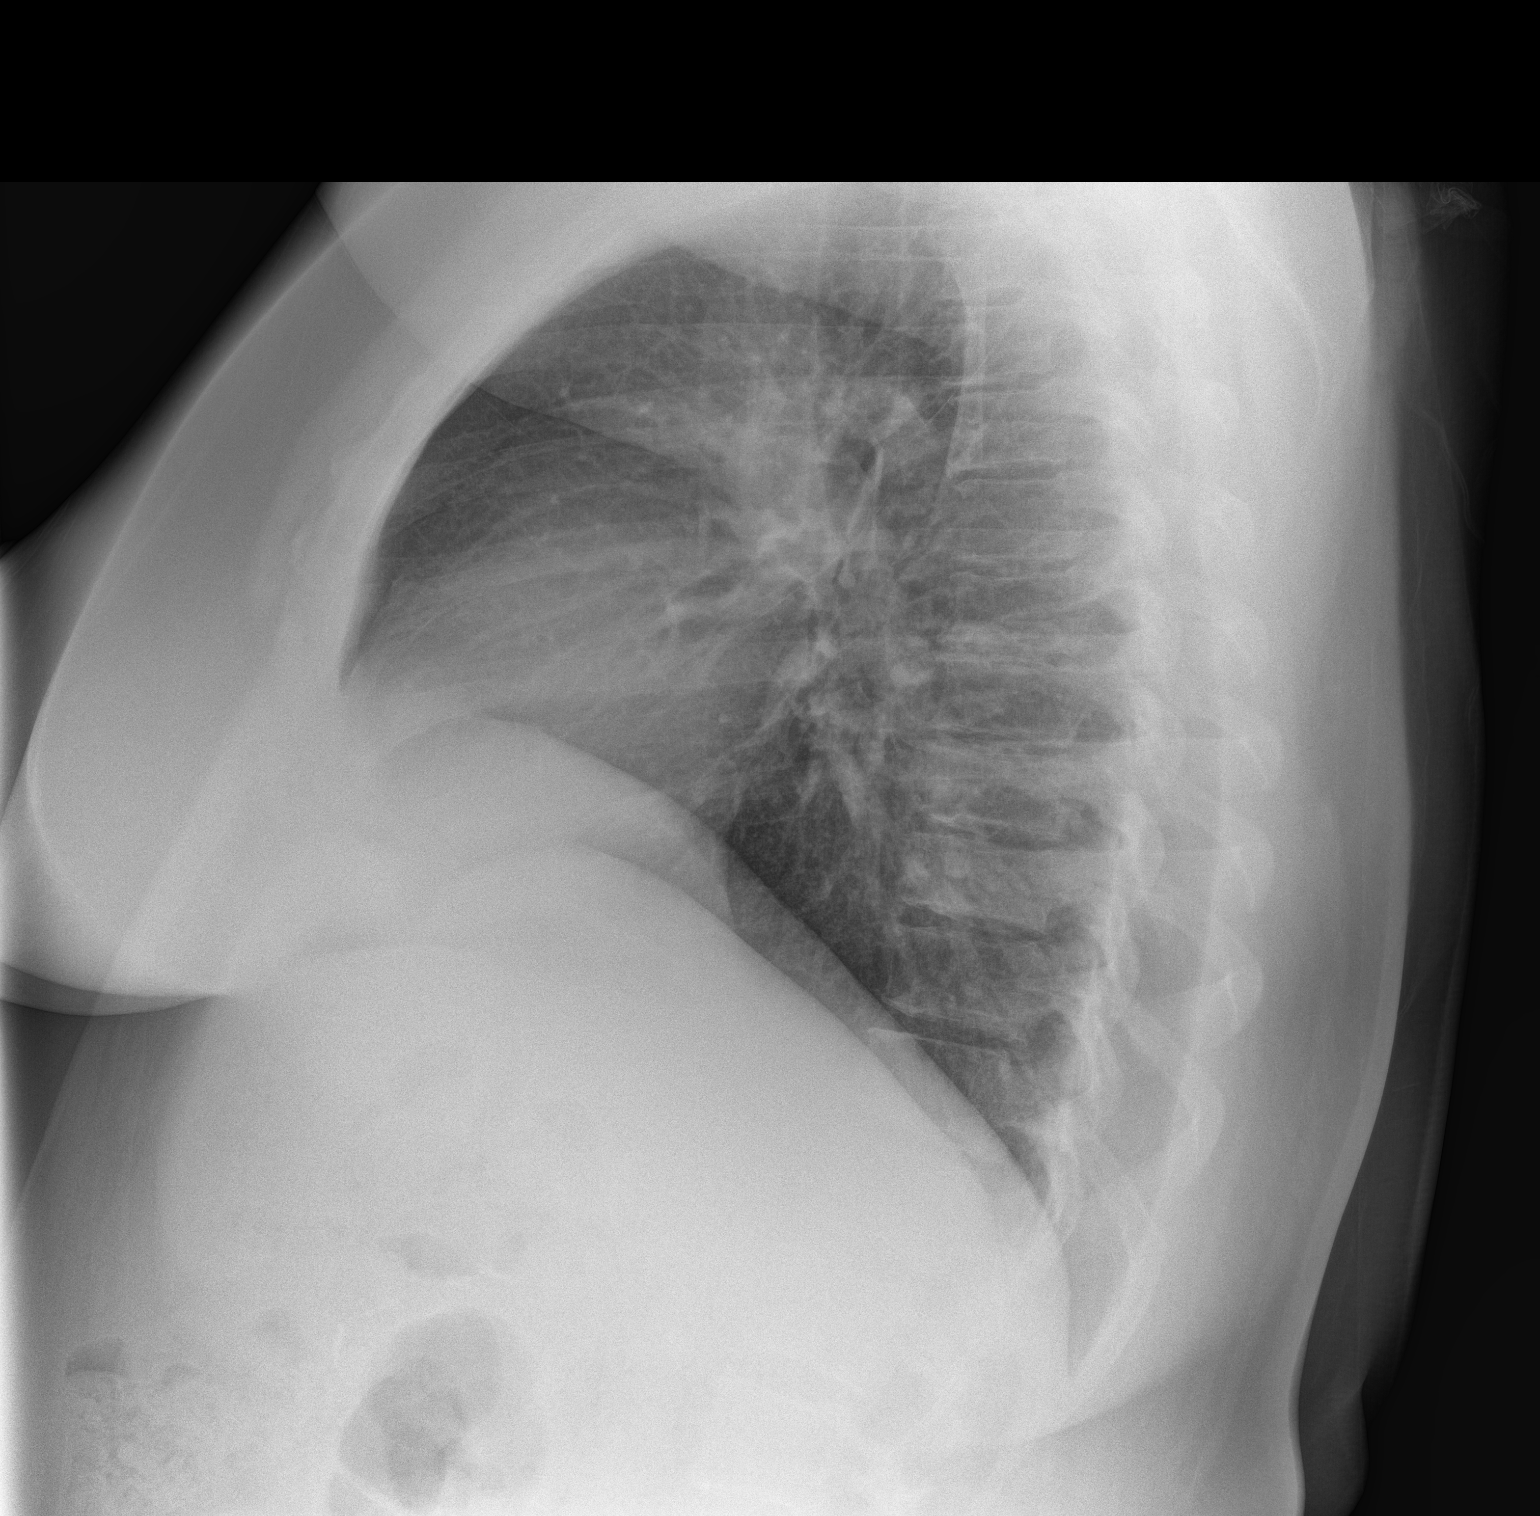

[2 of 2 positions shown; findings below may reference images not displayed]

FINDINGS: No active infiltrate or effusion is seen. Mediastinal and hilar
contours are unremarkable. The heart is within normal limits in
size. No bony abnormality is seen.
IMPRESSION: No active cardiopulmonary disease.

## 2016-05-01 ENCOUNTER — Encounter: Payer: Self-pay | Admitting: Physician Assistant

## 2016-05-01 ENCOUNTER — Ambulatory Visit: Payer: Self-pay | Admitting: Physician Assistant

## 2016-05-01 VITALS — BP 125/70 | HR 87 | Temp 98.8°F

## 2016-05-01 DIAGNOSIS — M25551 Pain in right hip: Secondary | ICD-10-CM

## 2016-05-01 MED ORDER — METHYLPREDNISOLONE 4 MG PO TBPK: ORAL_TABLET | ORAL | 0 refills | Status: DC

## 2016-05-01 MED ORDER — CYCLOBENZAPRINE HCL 10 MG PO TABS: 10.0000 mg | ORAL_TABLET | Freq: Three times a day (TID) | ORAL | 0 refills | Status: DC | PRN

## 2016-05-01 NOTE — Progress Notes (Signed)
S: c/o r hip pain, states been going on for a long time and just can't take it anymore, states pain increased after sitting or standing for any period of time, pain with movement, no known injury, no numbness or tingling, states its her hip not her back  O: vitals wnl, nad, skin intact, lumbar spine nontender, r hip pain reproduced with internal and external rotation, legs = length, unable to properly press on bony areas as pt is large; n/v intact  A: acute r hip pain  P: medrol dose pack, flexeril, refer to ortho for eval

## 2016-05-01 NOTE — Progress Notes (Signed)
Hip

## 2016-05-13 ENCOUNTER — Other Ambulatory Visit: Payer: Self-pay

## 2016-05-13 DIAGNOSIS — Z299 Encounter for prophylactic measures, unspecified: Secondary | ICD-10-CM

## 2016-05-13 NOTE — Progress Notes (Signed)
Patient came in to have blood drawn for testing per Dr. Toma Copierhris Aikens orders.

## 2016-05-13 NOTE — Progress Notes (Signed)
Patient was referred to [facility].  She will be seeing Hurshel KeysAlex Meloy, PA-C on 05/14/16 at 8:30.  Patient has been notified and has accepted the appointment.

## 2016-05-14 DIAGNOSIS — M47816 Spondylosis without myelopathy or radiculopathy, lumbar region: Secondary | ICD-10-CM | POA: Insufficient documentation

## 2016-05-14 LAB — CMP12+LP+TP+TSH+6AC+CBC/D/PLT
A/G RATIO: 1.3 (ref 1.2–2.2)
ALT: 11 IU/L (ref 0–32)
AST: 11 IU/L (ref 0–40)
Albumin: 4.1 g/dL (ref 3.5–5.5)
Alkaline Phosphatase: 76 IU/L (ref 39–117)
BASOS ABS: 0 10*3/uL (ref 0.0–0.2)
BUN / CREAT RATIO: 18 (ref 9–23)
BUN: 19 mg/dL (ref 6–20)
Basos: 0 %
CREATININE: 1.05 mg/dL — AB (ref 0.57–1.00)
Calcium: 9.6 mg/dL (ref 8.7–10.2)
Chloride: 103 mmol/L (ref 96–106)
Chol/HDL Ratio: 5.5 ratio units — ABNORMAL HIGH (ref 0.0–4.4)
Cholesterol, Total: 210 mg/dL — ABNORMAL HIGH (ref 100–199)
EOS (ABSOLUTE): 0.1 10*3/uL (ref 0.0–0.4)
EOS: 1 %
Estimated CHD Risk: 1.5 times avg. — ABNORMAL HIGH (ref 0.0–1.0)
FREE THYROXINE INDEX: 1.4 (ref 1.2–4.9)
GFR calc non Af Amer: 68 mL/min/{1.73_m2} (ref >59)
GFR, EST AFRICAN AMERICAN: 78 mL/min/{1.73_m2} (ref >59)
GGT: 16 IU/L (ref 0–60)
GLUCOSE: 90 mg/dL (ref 65–99)
Globulin, Total: 3.2 g/dL (ref 1.5–4.5)
HDL: 38 mg/dL — AB (ref >39)
HEMOGLOBIN: 12.7 g/dL (ref 11.1–15.9)
Hematocrit: 40.2 % (ref 34.0–46.6)
IMMATURE GRANULOCYTES: 0 %
IRON: 52 ug/dL (ref 27–159)
Immature Grans (Abs): 0 10*3/uL (ref 0.0–0.1)
LDH: 162 IU/L (ref 119–226)
LYMPHS: 14 %
Lymphocytes Absolute: 2 10*3/uL (ref 0.7–3.1)
MCH: 28 pg (ref 26.6–33.0)
MCHC: 31.6 g/dL (ref 31.5–35.7)
MCV: 89 fL (ref 79–97)
MONOCYTES: 5 %
Monocytes Absolute: 0.7 10*3/uL (ref 0.1–0.9)
NEUTROS PCT: 80 %
Neutrophils Absolute: 11.7 10*3/uL — ABNORMAL HIGH (ref 1.4–7.0)
PLATELETS: 373 10*3/uL (ref 150–379)
Phosphorus: 3.7 mg/dL (ref 2.5–4.5)
Potassium: 5 mmol/L (ref 3.5–5.2)
RBC: 4.53 x10E6/uL (ref 3.77–5.28)
RDW: 13.5 % (ref 12.3–15.4)
Sodium: 139 mmol/L (ref 134–144)
T3 UPTAKE RATIO: 20 % — AB (ref 24–39)
T4, Total: 7.1 ug/dL (ref 4.5–12.0)
TOTAL PROTEIN: 7.3 g/dL (ref 6.0–8.5)
TSH: 3.52 u[IU]/mL (ref 0.450–4.500)
Triglycerides: 416 mg/dL — ABNORMAL HIGH (ref 0–149)
URIC ACID: 6.2 mg/dL (ref 2.5–7.1)
WBC: 14.6 10*3/uL — ABNORMAL HIGH (ref 3.4–10.8)

## 2016-05-14 LAB — B12 AND FOLATE PANEL
Folate: 9.4 ng/mL (ref >3.0)
Vitamin B-12: 622 pg/mL (ref 211–946)

## 2016-05-14 LAB — HGB A1C W/O EAG: HEMOGLOBIN A1C: 5.3 % (ref 4.8–5.6)

## 2016-05-14 LAB — LITHIUM LEVEL: Lithium Lvl: 0.6 mmol/L (ref 0.6–1.2)

## 2016-07-24 ENCOUNTER — Ambulatory Visit: Payer: Managed Care, Other (non HMO) | Admitting: Obstetrics and Gynecology

## 2016-08-28 ENCOUNTER — Ambulatory Visit: Payer: Self-pay | Admitting: Physician Assistant

## 2016-08-28 ENCOUNTER — Encounter: Payer: Self-pay | Admitting: Physician Assistant

## 2016-08-28 VITALS — BP 140/80 | HR 91 | Temp 98.4°F

## 2016-08-28 DIAGNOSIS — M62830 Muscle spasm of back: Secondary | ICD-10-CM

## 2016-08-28 MED ORDER — BACLOFEN 10 MG PO TABS
10.0000 mg | ORAL_TABLET | Freq: Three times a day (TID) | ORAL | 4 refills | Status: DC
Start: 2016-08-28 — End: 2017-10-10

## 2016-08-28 NOTE — Progress Notes (Signed)
S: c/o muscles spasms in her back and hips, no numbness or tingling, no known injury, was seen at orthopedics and they told her she needed PT but hasn't been to PT due to time constraints, states the tightness will happen and last for about a week, this time she just woke up feeling really tight  O: vitals wnl, nad, spine nontender, full rom, tender at paravertebral muscles in lower back and across si joints to her hips, n/ v, lungs c t a, cv rrr  A: muscle spasms  P: baclofen, f/u with PT, home stretches, consider buying a tens unit

## 2016-10-07 ENCOUNTER — Other Ambulatory Visit: Payer: Self-pay

## 2016-10-07 VITALS — BP 150/85 | HR 89 | Temp 98.9°F | Ht 66.0 in | Wt 303.0 lb

## 2016-10-07 DIAGNOSIS — Z008 Encounter for other general examination: Secondary | ICD-10-CM

## 2016-10-07 DIAGNOSIS — Z0189 Encounter for other specified special examinations: Principal | ICD-10-CM

## 2016-10-07 NOTE — Progress Notes (Signed)
Here for biometrics, performed by rma, deacon

## 2016-10-25 ENCOUNTER — Ambulatory Visit: Payer: Self-pay | Admitting: Physician Assistant

## 2016-10-25 ENCOUNTER — Encounter: Payer: Self-pay | Admitting: Physician Assistant

## 2016-10-25 VITALS — BP 120/85 | HR 89 | Temp 99.0°F

## 2016-10-25 DIAGNOSIS — J069 Acute upper respiratory infection, unspecified: Secondary | ICD-10-CM

## 2016-10-25 LAB — POCT INFLUENZA A/B
Influenza A, POC: NEGATIVE
Influenza B, POC: NEGATIVE

## 2016-10-25 MED ORDER — AZITHROMYCIN 250 MG PO TABS: ORAL_TABLET | ORAL | 0 refills | Status: DC

## 2016-10-25 NOTE — Progress Notes (Signed)
S: C/o runny nose and congestion for 3 days, ? fever, chills, cp/sob, v/d; mucus is yellow and thick, cough is sporadic, c/o of facial and dental pain. Ear pain, traveled to [place] recently, was well while she was there  Using otc meds:   O: PE: vitals wnl, nad, perrl eomi, normocephalic, tms dull, nasal mucosa red and swollen, throat injected, neck supple no lymph, lungs c t a, cv rrr, neuro intact, flu swab neg  A:  Acute sinusitis   P: drink fluids, continue regular meds , use otc meds of choice, return if not improving in 5 days, return earlier if worsening, zpack

## 2016-11-06 ENCOUNTER — Encounter: Payer: Self-pay | Admitting: Obstetrics and Gynecology

## 2016-11-06 ENCOUNTER — Ambulatory Visit (INDEPENDENT_AMBULATORY_CARE_PROVIDER_SITE_OTHER): Payer: Managed Care, Other (non HMO) | Admitting: Obstetrics and Gynecology

## 2016-11-06 VITALS — BP 152/89 | HR 85 | Ht 66.0 in | Wt 302.3 lb

## 2016-11-06 DIAGNOSIS — E669 Obesity, unspecified: Secondary | ICD-10-CM

## 2016-11-06 DIAGNOSIS — Z3009 Encounter for other general counseling and advice on contraception: Secondary | ICD-10-CM | POA: Diagnosis not present

## 2016-11-06 DIAGNOSIS — IMO0001 Reserved for inherently not codable concepts without codable children: Secondary | ICD-10-CM

## 2016-11-06 DIAGNOSIS — Z8742 Personal history of other diseases of the female genital tract: Secondary | ICD-10-CM

## 2016-11-06 DIAGNOSIS — Z6841 Body Mass Index (BMI) 40.0 and over, adult: Secondary | ICD-10-CM

## 2016-11-06 DIAGNOSIS — N912 Amenorrhea, unspecified: Secondary | ICD-10-CM

## 2016-11-06 LAB — POCT URINE PREGNANCY: PREG TEST UR: NEGATIVE

## 2016-11-06 NOTE — Progress Notes (Signed)
GYN ENCOUNTER NOTE  Subjective:       Krystal Acosta is a 41 y.o. G0P0000 female is here for gynecologic evaluation of the following issues:  1. Amenorrhea.     LMP 09/26/2016 Vaginal spotting 10/22/2016 2 days No pelvic pain. History of hypothyroidism; on Synthroid 50 g a day; not taking medicine recently; last TSH was normal 8 months ago. Patient has not missed taking any oral contraceptives. Patient is interested in Mirena IUD; multiple questions were answered regarding this contraception choice.  Patient has no history of irregular menstrual cycles off of oral contraceptives. She does have history of dysmenorrhea. She denies hirsutism symptoms. She has never been diagnosed with PCO.  Obstetric History OB History  Gravida Para Term Preterm AB Living  0 0 0 0 0 0  SAB TAB Ectopic Multiple Live Births  0 0 0 0          Past Medical History:  Diagnosis Date  . AR (allergic rhinitis)   . Back pain   . Pernicious anemia   . Schizoaffective disorder [facility])     Past Surgical History:  Procedure Laterality Date  . CHOLECYSTECTOMY  2012    Current Outpatient Prescriptions on File Prior to Visit  Medication Sig Dispense Refill  . baclofen (LIORESAL) 10 MG tablet Take 1 tablet (10 mg total) by mouth 3 (three) times daily. 30 each 4  . cholecalciferol (VITAMIN D) 1000 units tablet Take 1,000 Units by mouth daily.    . cyanocobalamin (,VITAMIN B-12,) 1000 MCG/ML injection Inject as directed. monthly    . levothyroxine (SYNTHROID, LEVOTHROID) 25 MCG tablet Take 50 mcg by mouth daily before breakfast.     . lithium carbonate (LITHOBID) 300 MG CR tablet Take by mouth.    . metFORMIN (GLUCOPHAGE) 1000 MG tablet Take 1,000 mg by mouth.    . modafinil (PROVIGIL) 200 MG tablet Take 200 mg by mouth daily.    . norethindrone-ethinyl estradiol (NORTREL 7/7/7) 0.5/0.75/1-35 MG-MCG tablet Take 1 tablet by mouth daily. 3 Package 4  . OLANZapine-FLUoxetine (SYMBYAX) 3-25 MG capsule Take 1  capsule by mouth every evening.     No current facility-administered medications on file prior to visit.     Allergies  Allergen Reactions  . Hydrocodone-Acetaminophen Itching    Social History   Social History  . Marital status: Single    Spouse name: N/A  . Number of children: N/A  . Years of education: N/A   Occupational History  . Not on file.   Social History Main Topics  . Smoking status: Never Smoker  . Smokeless tobacco: Never Used  . Alcohol use No  . Drug use: No  . Sexual activity: Yes    Birth control/ protection: Pill   Other Topics Concern  . Not on file   Social History Narrative  . No narrative on file    Family History  Problem Relation Age of Onset  . Diabetes Mother   . Heart disease Father   . Cancer Neg Hx     The following portions of the patient's history were reviewed and updated as appropriate: allergies, current medications, past family history, past medical history, past social history, past surgical history and problem list.  Review of Systems Review of Systems  Constitutional: Positive for malaise/fatigue.  HENT: Negative.   Eyes: Negative.   Respiratory:       Recent sinus infection;no symptoms at present  Gastrointestinal:       No pelvic pain  Genitourinary: Negative.  Musculoskeletal: Negative.   Skin: Negative.   Neurological: Negative.   Endo/Heme/Allergies: Negative.   Psychiatric/Behavioral: Negative.    A vigorous centimeter  Objective:   BP (!) 152/89   Pulse 85   Ht  (1.676 m)   Wt (!) 302 lb 4.8 oz (137.1 kg)   LMP 10/01/2016 (Exact Date)   BMI 48.79 kg/m  Physical exam-deferred   Assessment:   1. Amenorrhea - POCT urine pregnancy - Beta HCG, Quant - TSH  2. History of dysmenorrhea  3. Class 3 obesity without serious comorbidity with body mass index (BMI) of 45.0 to 49.9 in adult, unspecified obesity type (HCC)  4. Encounter for other general counseling or advice on contraception      Plan:   1. UPT today is negative 2. [place] of hCG is drawn 3. TSH is drawn 4. Literature regarding Mirena IUD was given 5. Patient is to return at her convenience after 2 weeks of no intercourse for IUD insertion 6. Patient is counseled to be diligent with Synthroid dosing; she understands this can cause abnormal uterine bleeding or amenorrhea  hypothyroidism becomes clinically significant.  A total of 15 minutes were spent face-to-face with the patient during this encounter and over half of that time dealt with counseling and coordination of care.  Herold Harms, MD  Note: This dictation was prepared with Dragon dictation along with smaller phrase technology. Any transcriptional errors that result from this process are unintentional.

## 2016-11-06 NOTE — Patient Instructions (Signed)
1. Quantitative hCG and TSH are drawn today to assess amenorrhea state 2. Results will be made available through my chart 3. Call in your convenience to schedule IUD insertion. The need to be abstinent for 2 weeks before her IUD insertion  Levonorgestrel intrauterine device (IUD) What is this medicine? LEVONORGESTREL IUD (LEE voe nor jes trel) is a contraceptive (birth control) device. The device is placed inside the uterus by a healthcare professional. It is used to prevent pregnancy. This device can also be used to treat heavy bleeding that occurs during your period. This medicine may be used for other purposes; ask your health care provider or pharmacist if you have questions. COMMON BRAND NAME(S): Cameron Ali What should I tell my health care provider before I take this medicine? They need to know if you have any of these conditions: -abnormal Pap smear -cancer of the breast, uterus, or cervix -diabetes -endometritis -genital or pelvic infection now or in the past -have more than one sexual partner or your partner has more than one partner -heart disease -history of an ectopic or tubal pregnancy -immune system problems -IUD in place -liver disease or tumor -problems with blood clots or take blood-thinners -seizures -use intravenous drugs -uterus of unusual shape -vaginal bleeding that has not been explained -an unusual or allergic reaction to levonorgestrel, other hormones, silicone, or polyethylene, medicines, foods, dyes, or preservatives -pregnant or trying to get pregnant -breast-feeding How should I use this medicine? This device is placed inside the uterus by a health care professional. Talk to your pediatrician regarding the use of this medicine in children. Special care may be needed. Overdosage: If you think you have taken too much of this medicine contact a poison control center or emergency room at once. NOTE: This medicine is only for you. Do not  share this medicine with others. What if I miss a dose? This does not apply. Depending on the brand of device you have inserted, the device will need to be replaced every 3 to 5 years if you wish to continue using this type of birth control. What may interact with this medicine? Do not take this medicine with any of the following medications: -amprenavir -bosentan -fosamprenavir This medicine may also interact with the following medications: -aprepitant -armodafinil -barbiturate medicines for inducing sleep or treating seizures -bexarotene -boceprevir -griseofulvin -medicines to treat seizures like carbamazepine, ethotoin, felbamate, oxcarbazepine, phenytoin, topiramate -modafinil -pioglitazone -rifabutin -rifampin -rifapentine -some medicines to treat HIV infection like atazanavir, efavirenz, indinavir, lopinavir, nelfinavir, tipranavir, ritonavir -St. John's wort -warfarin This list may not describe all possible interactions. Give your health care provider a list of all the medicines, herbs, non-prescription drugs, or dietary supplements you use. Also tell them if you smoke, drink alcohol, or use illegal drugs. Some items may interact with your medicine. What should I watch for while using this medicine? Visit your doctor or health care professional for regular check ups. See your doctor if you or your partner has sexual contact with others, becomes HIV positive, or gets a sexual transmitted disease. This product does not protect you against HIV infection (AIDS) or other sexually transmitted diseases. You can check the placement of the IUD yourself by reaching up to the top of your vagina with clean fingers to feel the threads. Do not pull on the threads. It is a good habit to check placement after each menstrual period. Call your doctor right away if you feel more of the IUD than just the threads or if  you cannot feel the threads at all. The IUD may come out by itself. You may become  pregnant if the device comes out. If you notice that the IUD has come out use a backup birth control method like condoms and call your health care provider. Using tampons will not change the position of the IUD and are okay to use during your period. This IUD can be safely scanned with magnetic resonance imaging (MRI) only under specific conditions. Before you have an MRI, tell your healthcare provider that you have an IUD in place, and which type of IUD you have in place. What side effects may I notice from receiving this medicine? Side effects that you should report to your doctor or health care professional as soon as possible: -allergic reactions like skin rash, itching or hives, swelling of the face, lips, or tongue -fever, flu-like symptoms -genital sores -high blood pressure -no menstrual period for 6 weeks during use -pain, swelling, warmth in the leg -pelvic pain or tenderness -severe or sudden headache -signs of pregnancy -stomach cramping -sudden shortness of breath -trouble with balance, talking, or walking -unusual vaginal bleeding, discharge -yellowing of the eyes or skin Side effects that usually do not require medical attention (report to your doctor or health care professional if they continue or are bothersome): -acne -breast pain -change in sex drive or performance -changes in weight -cramping, dizziness, or faintness while the device is being inserted -headache -irregular menstrual bleeding within first 3 to 6 months of use -nausea This list may not describe all possible side effects. Call your doctor for medical advice about side effects. You may report side effects to FDA at 1-800-FDA-1088. Where should I keep my medicine? This does not apply. NOTE: This sheet is a summary. It may not cover all possible information. If you have questions about this medicine, talk to your doctor, pharmacist, or health care provider.  2018 Elsevier/Gold Standard (2016-04-12  14:14:56)

## 2016-11-07 LAB — BETA HCG QUANT (REF LAB): hCG Quant: <1 m[IU]/mL

## 2016-11-07 LAB — TSH: TSH: 3.86 u[IU]/mL (ref 0.450–4.500)

## 2017-03-24 NOTE — Progress Notes (Deleted)
ANNUAL PREVENTATIVE CARE GYN  ENCOUNTER NOTE  Subjective:       Krystal Acosta is a 41 y.o. G0P0000 female here for a routine annual gynecologic exam.  Current complaints:      Gynecologic History Menarche: Age 33. Cycles: Regular, monthly, lasting 5 days. History of dysmenorrhea requiring 8.  Advil a day (when not taking OCPs); no symptoms on OCPs. History of one abnormal Pap smear; follow-up Pap normal. No history of PID or STI Patient's last menstrual period was 02/28/2015 (exact date). Contraception: OCP (estrogen/progesterone) Last Pap:  03/16/2015 negative/negative Last mammogram: 2012 Results were: normal  Obstetric History OB History  Gravida Para Term Preterm AB Living  0 0 0 0 0 0  SAB TAB Ectopic Multiple Live Births  0 0 0 0          Past Medical History:  Diagnosis Date  . AR (allergic rhinitis)   . Back pain   . Pernicious anemia   . Schizoaffective disorder [facility])     Past Surgical History:  Procedure Laterality Date  . CHOLECYSTECTOMY  2012    Current Outpatient Prescriptions on File Prior to Visit  Medication Sig Dispense Refill  . baclofen (LIORESAL) 10 MG tablet Take 1 tablet (10 mg total) by mouth 3 (three) times daily. 30 each 4  . cholecalciferol (VITAMIN D) 1000 units tablet Take 1,000 Units by mouth daily.    . cyanocobalamin (,VITAMIN B-12,) 1000 MCG/ML injection Inject as directed. monthly    . levothyroxine (SYNTHROID, LEVOTHROID) 25 MCG tablet Take 50 mcg by mouth daily before breakfast.     . lithium carbonate (LITHOBID) 300 MG CR tablet Take by mouth.    . metFORMIN (GLUCOPHAGE) 1000 MG tablet Take 1,000 mg by mouth.    . modafinil (PROVIGIL) 200 MG tablet Take 200 mg by mouth daily.    . norethindrone-ethinyl estradiol (NORTREL 7/7/7) 0.5/0.75/1-35 MG-MCG tablet Take 1 tablet by mouth daily. 3 Package 4  . OLANZapine-FLUoxetine (SYMBYAX) 3-25 MG capsule Take 1 capsule by mouth every evening.     No current facility-administered  medications on file prior to visit.     Allergies  Allergen Reactions  . Hydrocodone-Acetaminophen Itching    Social History   Social History  . Marital status: Single    Spouse name: N/A  . Number of children: N/A  . Years of education: N/A   Occupational History  . Not on file.   Social History Main Topics  . Smoking status: Never Smoker  . Smokeless tobacco: Never Used  . Alcohol use No  . Drug use: No  . Sexual activity: Yes    Birth control/ protection: Pill   Other Topics Concern  . Not on file   Social History Narrative  . No narrative on file    Family History  Problem Relation Age of Onset  . Diabetes Mother   . Heart disease Father   . Cancer Neg Hx     The following portions of the patient's history were reviewed and updated as appropriate: allergies, current medications, past family history, past medical history, past social history, past surgical history and problem list.  Review of Systems ROS   Objective:   There were no vitals taken for this visit. CONSTITUTIONAL: Well-developed, well-nourished female in no acute distress.  PSYCHIATRIC: Normal mood and affect. Normal behavior. Normal judgment and thought content. NEUROLGIC: Alert and oriented to person, place, and time. Normal muscle tone coordination. No cranial nerve deficit noted. HENT:  Normocephalic, atraumatic, External  right and left ear normal.  EYES: Conjunctivae and EOM are normal.No scleral icterus.  NECK: Normal range of motion, supple, no masses.  Normal thyroid.  SKIN: Skin is warm and dry. No rash noted. Not diaphoretic. No erythema. No pallor. CARDIOVASCULAR: Normal heart rate noted, regular rhythm, no murmur. RESPIRATORY: Clear to auscultation bilaterally. Effort and breath sounds normal, no problems with respiration noted. BREASTS: Symmetric in size. No masses, skin changes, nipple drainage, or lymphadenopathy. ABDOMEN: Soft, normal bowel sounds, no distention noted.  No  tenderness, rebound or guarding.  BLADDER: Normal PELVIC:                       External Genitalia: Normal                       BUS: Normal                       Vagina: Normal; burgundy menstrual blood in vault                       Cervix: Normal; No lesions; no significant cervical motion tenderness                       Uterus: Normal; Midplane, normal size, shape, mobile, nontender                       Adnexa: Normal; Nonpalpable, nontender                       RV: External Exam NormaI  MUSCULOSKELETAL: Normal range of motion. No tenderness.  No cyanosis, clubbing, or edema.  2+ distal pulses. LYMPHATIC: No Axillary, Supraclavicular, or Inguinal Adenopathy.    Assessment:   Annual gynecologic examination 41 y.o. Contraception: OCP (estrogen/progesterone) Nortrel 7/7/7  Obesity 2 increased weight this year Problem List Items Addressed This Visit    Class 3 obesity without serious comorbidity with body mass index (BMI) of 45.0 to 49.9 in adult [facility](HCC)   History of dysmenorrhea    Other Visit Diagnoses    Well woman exam with routine gynecological exam    -  Primary     Hypothyroidism, recently started on Synthroid 25 g a day Plan:  Pap: due 2019 Mammogram: Not Indicated Stool Guaiac Testing:  Not Indicated Labs: thur pcp Routine preventative health maintenance measures emphasized: Exercise/Diet/Weight control, Tobacco Warnings and Alcohol/Substance use risks  Refill birth control pills Return to Clinic - 1 Year   [place] Jaryn Hocutt, [place]    Note: This dictation was prepared with Dragon dictation along with smaller phrase technology. Any transcriptional errors that result from this process are unintentional.   Note: This dictation was prepared with Dragon dictation along with smaller phrase technology. Any transcriptional errors that result from this process are unintentional.

## 2017-03-26 ENCOUNTER — Encounter: Payer: Managed Care, Other (non HMO) | Admitting: Obstetrics and Gynecology

## 2017-04-09 ENCOUNTER — Encounter: Payer: Self-pay | Admitting: Obstetrics and Gynecology

## 2017-04-09 ENCOUNTER — Ambulatory Visit (INDEPENDENT_AMBULATORY_CARE_PROVIDER_SITE_OTHER): Payer: Managed Care, Other (non HMO) | Admitting: Obstetrics and Gynecology

## 2017-04-09 VITALS — BP 138/80 | HR 89 | Ht 66.0 in | Wt 290.4 lb

## 2017-04-09 DIAGNOSIS — E669 Obesity, unspecified: Secondary | ICD-10-CM

## 2017-04-09 DIAGNOSIS — Z30430 Encounter for insertion of intrauterine contraceptive device: Secondary | ICD-10-CM

## 2017-04-09 DIAGNOSIS — IMO0001 Reserved for inherently not codable concepts without codable children: Secondary | ICD-10-CM

## 2017-04-09 DIAGNOSIS — Z8742 Personal history of other diseases of the female genital tract: Secondary | ICD-10-CM

## 2017-04-09 DIAGNOSIS — Z6841 Body Mass Index (BMI) 40.0 and over, adult: Secondary | ICD-10-CM

## 2017-04-09 LAB — POCT URINE PREGNANCY: PREG TEST UR: NEGATIVE

## 2017-04-09 NOTE — Patient Instructions (Signed)
1. Mirena IUD is inserted today. The IUD should be scheduled for removal on 04/09/2022 2. Return on 04/30/2017 for annual exam and IUD check.  Levonorgestrel intrauterine device (IUD) What is this medicine? LEVONORGESTREL IUD (LEE voe nor jes trel) is a contraceptive (birth control) device. The device is placed inside the uterus by a healthcare professional. It is used to prevent pregnancy. This device can also be used to treat heavy bleeding that occurs during your period. This medicine may be used for other purposes; ask your health care provider or pharmacist if you have questions. COMMON BRAND NAME(S): Cameron Ali What should I tell my health care provider before I take this medicine? They need to know if you have any of these conditions: -abnormal Pap smear -cancer of the breast, uterus, or cervix -diabetes -endometritis -genital or pelvic infection now or in the past -have more than one sexual partner or your partner has more than one partner -heart disease -history of an ectopic or tubal pregnancy -immune system problems -IUD in place -liver disease or tumor -problems with blood clots or take blood-thinners -seizures -use intravenous drugs -uterus of unusual shape -vaginal bleeding that has not been explained -an unusual or allergic reaction to levonorgestrel, other hormones, silicone, or polyethylene, medicines, foods, dyes, or preservatives -pregnant or trying to get pregnant -breast-feeding How should I use this medicine? This device is placed inside the uterus by a health care professional. Talk to your pediatrician regarding the use of this medicine in children. Special care may be needed. Overdosage: If you think you have taken too much of this medicine contact a poison control center or emergency room at once. NOTE: This medicine is only for you. Do not share this medicine with others. What if I miss a dose? This does not apply. Depending on the  brand of device you have inserted, the device will need to be replaced every 3 to 5 years if you wish to continue using this type of birth control. What may interact with this medicine? Do not take this medicine with any of the following medications: -amprenavir -bosentan -fosamprenavir This medicine may also interact with the following medications: -aprepitant -armodafinil -barbiturate medicines for inducing sleep or treating seizures -bexarotene -boceprevir -griseofulvin -medicines to treat seizures like carbamazepine, ethotoin, felbamate, oxcarbazepine, phenytoin, topiramate -modafinil -pioglitazone -rifabutin -rifampin -rifapentine -some medicines to treat HIV infection like atazanavir, efavirenz, indinavir, lopinavir, nelfinavir, tipranavir, ritonavir -St. John's wort -warfarin This list may not describe all possible interactions. Give your health care provider a list of all the medicines, herbs, non-prescription drugs, or dietary supplements you use. Also tell them if you smoke, drink alcohol, or use illegal drugs. Some items may interact with your medicine. What should I watch for while using this medicine? Visit your doctor or health care professional for regular check ups. See your doctor if you or your partner has sexual contact with others, becomes HIV positive, or gets a sexual transmitted disease. This product does not protect you against HIV infection (AIDS) or other sexually transmitted diseases. You can check the placement of the IUD yourself by reaching up to the top of your vagina with clean fingers to feel the threads. Do not pull on the threads. It is a good habit to check placement after each menstrual period. Call your doctor right away if you feel more of the IUD than just the threads or if you cannot feel the threads at all. The IUD may come out by itself. You may become  pregnant if the device comes out. If you notice that the IUD has come out use a backup birth  control method like condoms and call your health care provider. Using tampons will not change the position of the IUD and are okay to use during your period. This IUD can be safely scanned with magnetic resonance imaging (MRI) only under specific conditions. Before you have an MRI, tell your healthcare provider that you have an IUD in place, and which type of IUD you have in place. What side effects may I notice from receiving this medicine? Side effects that you should report to your doctor or health care professional as soon as possible: -allergic reactions like skin rash, itching or hives, swelling of the face, lips, or tongue -fever, flu-like symptoms -genital sores -high blood pressure -no menstrual period for 6 weeks during use -pain, swelling, warmth in the leg -pelvic pain or tenderness -severe or sudden headache -signs of pregnancy -stomach cramping -sudden shortness of breath -trouble with balance, talking, or walking -unusual vaginal bleeding, discharge -yellowing of the eyes or skin Side effects that usually do not require medical attention (report to your doctor or health care professional if they continue or are bothersome): -acne -breast pain -change in sex drive or performance -changes in weight -cramping, dizziness, or faintness while the device is being inserted -headache -irregular menstrual bleeding within first 3 to 6 months of use -nausea This list may not describe all possible side effects. Call your doctor for medical advice about side effects. You may report side effects to FDA at 1-800-FDA-1088. Where should I keep my medicine? This does not apply. NOTE: This sheet is a summary. It may not cover all possible information. If you have questions about this medicine, talk to your doctor, pharmacist, or health care provider.  2018 Elsevier/Gold Standard (2016-04-12 14:14:56)  Intrauterine Device Insertion, Care After This sheet gives you information about how  to care for yourself after your procedure. Your health care provider may also give you more specific instructions. If you have problems or questions, contact your health care provider. What can I expect after the procedure? After the procedure, it is common to have:  Cramps and pain in the abdomen.  Light bleeding (spotting) or heavier bleeding that is like your menstrual period. This may last for up to a few days.  Lower back pain.  Dizziness.  Headaches.  Nausea.  Follow these instructions at home:  Before resuming sexual activity, check to make sure that you can feel the IUD string(s). You should be able to feel the end of the string(s) below the opening of your cervix. If your IUD string is in place, you may resume sexual activity. ? If you had a hormonal IUD inserted more than 7 days after your most recent period started, you will need to use a backup method of birth control for 7 days after IUD insertion. Ask your health care provider whether this applies to you.  Continue to check that the IUD is still in place by feeling for the string(s) after every menstrual period, or once a month.  Take over-the-counter and prescription medicines only as told by your health care provider.  Do not drive or use heavy machinery while taking prescription pain medicine.  Keep all follow-up visits as told by your health care provider. This is important. Contact a health care provider if:  You have bleeding that is heavier or lasts longer than a normal menstrual cycle.  You have a fever.  You have cramps or abdominal pain that get worse or do not get better with medicine.  You develop abdominal pain that is new or is not in the same area of earlier cramping and pain.  You feel lightheaded or weak.  You have abnormal or bad-smelling discharge from your vagina.  You have pain during sexual activity.  You have any of the following problems with your IUD string(s): ? The string bothers  or hurts you or your sexual partner. ? You cannot feel the string. ? The string has gotten longer.  You can feel the IUD in your vagina.  You think you may be pregnant, or you miss your menstrual period.  You think you may have an STI (sexually transmitted infection). Get help right away if:  You have flu-like symptoms.  You have a fever and chills.  You can feel that your IUD has slipped out of place. Summary  After the procedure, it is common to have cramps and pain in the abdomen. It is also common to have light bleeding (spotting) or heavier bleeding that is like your menstrual period.  Continue to check that the IUD is still in place by feeling for the string(s) after every menstrual period, or once a month.  Keep all follow-up visits as told by your health care provider. This is important.  Contact your health care provider if you have problems with your IUD string(s), such as the string getting longer or bothering you or your sexual partner. This information is not intended to replace advice given to you by your health care provider. Make sure you discuss any questions you have with your health care provider. Document Released: 02/27/2011 Document Revised: 05/22/2016 Document Reviewed: 05/22/2016 Elsevier Interactive Patient Education  2017 [employer].

## 2017-04-09 NOTE — Progress Notes (Signed)
Chief complaint: 1. Encounter for IUD insertion  IUD Insertion Procedure Note  Pre-operative Diagnosis: Contraception; dysmenorrhea  Post-operative Diagnosis: Same  Procedure Details  Urine pregnancy test was done  and result was negative.  The risks (including infection, bleeding, pain, and uterine perforation) and benefits of the procedure were explained to the patient and Verbal informed consent was obtained.    Cervix cleansed with Betadine. Uterus sounded to 9 cm. IUD inserted without difficulty. String visible and trimmed to 3 cm. Patient tolerated procedure well.  IUD Information: Mirena, Lot # [id number], Expiration date February 2021.  Condition: Stable  Complications: None  Plan:  The patient was advised to call for any fever or for prolonged or severe pain or bleeding. She was advised to use OTC acetaminophen and OTC ibuprofen as needed for mild to moderate pain.   Attending Physician Documentation: Herold Harms, MD

## 2017-04-16 ENCOUNTER — Encounter: Payer: Managed Care, Other (non HMO) | Admitting: Obstetrics and Gynecology

## 2017-04-28 NOTE — Progress Notes (Signed)
ANNUAL PREVENTATIVE CARE GYN  ENCOUNTER NOTE  Subjective:       Krystal Acosta is a 41 y.o. G0P0000 female here for a routine annual gynecologic exam.  Current complaints: 1. iud string check  Bowel and bladder function are normal. Menses are regular and not heavy. She is not experiencing any significant dysmenorrhea. There are no new major interval health issues.    Gynecologic History Menarche: Age 64. Cycles: Regular, monthly, lasting 5 days. Past History of dysmenorrhea requiring 8.  Advil a day (when not taking OCPs);  History of one abnormal Pap smear; follow-up Pap normal. No history of PID or STI Patient's last menstrual period was 02/28/2015 (exact date). Contraception:iud inserted 04/09/2017 Last Pap:  03/16/2015 negative/negative Last mammogram: 2012 Results were: normal  Obstetric History OB History  Gravida Para Term Preterm AB Living  0 0 0 0 0 0  SAB TAB Ectopic Multiple Live Births  0 0 0 0          Past Medical History:  Diagnosis Date  . AR (allergic rhinitis)   . Back pain   . Pernicious anemia   . Schizoaffective disorder [facility])     Past Surgical History:  Procedure Laterality Date  . CHOLECYSTECTOMY  2012    Current Outpatient Prescriptions on File Prior to Visit  Medication Sig Dispense Refill  . baclofen (LIORESAL) 10 MG tablet Take 1 tablet (10 mg total) by mouth 3 (three) times daily. 30 each 4  . cholecalciferol (VITAMIN D) 1000 units tablet Take 1,000 Units by mouth daily.    . cyanocobalamin (,VITAMIN B-12,) 1000 MCG/ML injection Inject as directed. monthly    . levothyroxine (SYNTHROID, LEVOTHROID) 25 MCG tablet Take 50 mcg by mouth daily before breakfast.     . lithium carbonate (LITHOBID) 300 MG CR tablet Take by mouth.    . metFORMIN (GLUCOPHAGE) 1000 MG tablet Take 1,000 mg by mouth.    . norethindrone-ethinyl estradiol (NORTREL 7/7/7) 0.5/0.75/1-35 MG-MCG tablet Take 1 tablet by mouth daily. 3 Package 4  . OLANZapine-FLUoxetine  (SYMBYAX) 3-25 MG capsule Take 1 capsule by mouth every evening.     No current facility-administered medications on file prior to visit.     Allergies  Allergen Reactions  . Hydrocodone-Acetaminophen Itching    Social History   Social History  . Marital status: Single    Spouse name: N/A  . Number of children: N/A  . Years of education: N/A   Occupational History  . Not on file.   Social History Main Topics  . Smoking status: Never Smoker  . Smokeless tobacco: Never Used  . Alcohol use No  . Drug use: No  . Sexual activity: Yes    Birth control/ protection: Pill   Other Topics Concern  . Not on file   Social History Narrative  . No narrative on file    Family History  Problem Relation Age of Onset  . Diabetes Mother   . Heart disease Father   . Cancer Neg Hx     The following portions of the patient's history were reviewed and updated as appropriate: allergies, current medications, past family history, past medical history, past social history, past surgical history and problem list.  Review of Systems Review of Systems  Constitutional: Negative.   HENT: Negative.   Eyes: Negative.   Respiratory: Negative.   Cardiovascular: Negative.   Gastrointestinal: Negative.   Genitourinary: Negative.   Musculoskeletal: Negative.   Skin: Negative.   Neurological: Negative.   Endo/Heme/Allergies:  Negative.   Psychiatric/Behavioral: Negative.      Objective:   BP 127/84   Pulse 77   Ht  (1.676 m)   Wt 290 lb 12.8 oz (131.9 kg)   LMP 04/14/2017 (Approximate)   BMI 46.94 kg/m  CONSTITUTIONAL: Well-developed, well-nourished female in no acute distress.  PSYCHIATRIC: Normal mood and affect. Normal behavior. Normal judgment and thought content. NEUROLGIC: Alert and oriented to person, place, and time. Normal muscle tone coordination. No cranial nerve deficit noted. HENT:  Normocephalic, atraumatic, External right and left ear normal.  EYES: Conjunctivae  and EOM are normal.No scleral icterus.  NECK: Normal range of motion, supple, no masses.  Normal thyroid.  SKIN: Skin is warm and dry. No rash noted. Not diaphoretic. No erythema. No pallor. CARDIOVASCULAR: Normal heart rate noted, regular rhythm, no murmur. RESPIRATORY: Clear to auscultation bilaterally. Effort and breath sounds normal, no problems with respiration noted. BREASTS: Symmetric in size. No masses, skin changes, nipple drainage, or lymphadenopathy. ABDOMEN: Soft, normal bowel sounds, no distention noted.  No tenderness, rebound or guarding.  BLADDER: Normal PELVIC:                       External Genitalia: Normal                       BUS: Normal                       Vagina: Normal;Brown mucousy discharge/old blood in vault                       Cervix: Normal; No lesions; no significant cervical motion tenderness; IUD string 3 cm                       Uterus: Normal; Midplane, normal size, shape, mobile, nontender                       Adnexa: Normal; Nonpalpable, nontender                       RV: External Exam NormaI  MUSCULOSKELETAL: Normal range of motion. No tenderness.  No cyanosis, clubbing, or edema.  2+ distal pulses. LYMPHATIC: No Axillary, Supraclavicular, or Inguinal Adenopathy.    Assessment:   Annual gynecologic examination 41 y.o. Contraception: mirena Obesity 2 BMI 46 Problem List Items Addressed This Visit    None    Visit Diagnoses    Well woman exam with routine gynecological exam    -  Primary     Hypothyroidism, recently started on Synthroid 25 g a day Normal IUD string check History of dysmenorrhea  Plan:  Pap: due 2019 Mammogram: Not Indicated Stool Guaiac Testing:  Not Indicated Labs: thur pcp Routine preventative health maintenance measures emphasized: Exercise/Diet/Weight control, Tobacco Warnings and Alcohol/Substance use risks  Return to Clinic - 1 Year  [employer], CMA  Herold Harms, MD  Note: This dictation was  prepared with Dragon dictation along with smaller phrase technology. Any transcriptional errors that result from this process are unintentional.

## 2017-04-30 ENCOUNTER — Ambulatory Visit (INDEPENDENT_AMBULATORY_CARE_PROVIDER_SITE_OTHER): Payer: Managed Care, Other (non HMO) | Admitting: Obstetrics and Gynecology

## 2017-04-30 ENCOUNTER — Encounter: Payer: Self-pay | Admitting: Obstetrics and Gynecology

## 2017-04-30 VITALS — BP 127/84 | HR 77 | Ht 66.0 in | Wt 290.8 lb

## 2017-04-30 DIAGNOSIS — Z30431 Encounter for routine checking of intrauterine contraceptive device: Secondary | ICD-10-CM | POA: Diagnosis not present

## 2017-04-30 DIAGNOSIS — Z01419 Encounter for gynecological examination (general) (routine) without abnormal findings: Secondary | ICD-10-CM | POA: Diagnosis not present

## 2017-04-30 DIAGNOSIS — Z8742 Personal history of other diseases of the female genital tract: Secondary | ICD-10-CM

## 2017-04-30 MED ORDER — NORETHIN-ETH ESTRAD TRIPHASIC 0.5/0.75/1-35 MG-MCG PO TABS: 1.0000 | ORAL_TABLET | Freq: Every day | ORAL | 4 refills | Status: DC

## 2017-04-30 NOTE — Patient Instructions (Signed)
1. No Pap smear today. Next Pap smear is due in 2019 2. Continue self breast awareness 3. Screening labs are to be obtained through primary care 4. Continue with healthy eating, exercise, and control weight loss 5. Contraception-Mirena IUD 6. Return in 1 year for annual exam  Health Maintenance, Female Adopting a healthy lifestyle and getting preventive care can go a long way to promote health and wellness. Talk with your health care provider about what schedule of regular examinations is right for you. This is a good chance for you to check in with your provider about disease prevention and staying healthy. In between checkups, there are plenty of things you can do on your own. Experts have done a lot of research about which lifestyle changes and preventive measures are most likely to keep you healthy. Ask your health care provider for more information. Weight and diet Eat a healthy diet  Be sure to include plenty of vegetables, fruits, low-fat dairy products, and lean protein.  Do not eat a lot of foods high in solid fats, added sugars, or salt.  Get regular exercise. This is one of the most important things you can do for your health. ? Most adults should exercise for at least 150 minutes each week. The exercise should increase your heart rate and make you sweat (moderate-intensity exercise). ? Most adults should also do strengthening exercises at least twice a week. This is in addition to the moderate-intensity exercise.  Maintain a healthy weight  Body mass index (BMI) is a measurement that can be used to identify possible weight problems. It estimates body fat based on height and weight. Your health care provider can help determine your BMI and help you achieve or maintain a healthy weight.  For females 20 years of age and older: ? A BMI below 18.5 is considered underweight. ? A BMI of 18.5 to 24.9 is normal. ? A BMI of 25 to 29.9 is considered overweight. ? A BMI of 30 and above  is considered obese.  Watch levels of cholesterol and blood lipids  You should start having your blood tested for lipids and cholesterol at 39 years of age, then have this test every 5 years.  You may need to have your cholesterol levels checked more often if: ? Your lipid or cholesterol levels are high. ? You are older than 39 years of age. ? You are at high risk for heart disease.  Cancer screening Lung Cancer  Lung cancer screening is recommended for adults 55-80 years old who are at high risk for lung cancer because of a history of smoking.  A yearly low-dose CT scan of the lungs is recommended for people who: ? Currently smoke. ? Have quit within the past 15 years. ? Have at least a 30-pack-year history of smoking. A pack year is smoking an average of one pack of cigarettes a day for 1 year.  Yearly screening should continue until it has been 15 years since you quit.  Yearly screening should stop if you develop a health problem that would prevent you from having lung cancer treatment.  Breast Cancer  Practice breast self-awareness. This means understanding how your breasts normally appear and feel.  It also means doing regular breast self-exams. Let your health care provider know about any changes, no matter how small.  If you are in your 20s or 30s, you should have a clinical breast exam (CBE) by a health care provider every 1-3 years as part of a regular   health exam.  If you are 40 or older, have a CBE every year. Also consider having a breast X-ray (mammogram) every year.  If you have a family history of breast cancer, talk to your health care provider about genetic screening.  If you are at high risk for breast cancer, talk to your health care provider about having an MRI and a mammogram every year.  Breast cancer gene (BRCA) assessment is recommended for women who have family members with BRCA-related cancers. BRCA-related cancers  include: ? Breast. ? Ovarian. ? Tubal. ? Peritoneal cancers.  Results of the assessment will determine the need for genetic counseling and BRCA1 and BRCA2 testing.  Cervical Cancer Your health care provider may recommend that you be screened regularly for cancer of the pelvic organs (ovaries, uterus, and vagina). This screening involves a pelvic examination, including checking for microscopic changes to the surface of your cervix (Pap test). You may be encouraged to have this screening done every 3 years, beginning at age 21.  For women ages 30-65, health care providers may recommend pelvic exams and Pap testing every 3 years, or they may recommend the Pap and pelvic exam, combined with testing for human papilloma virus (HPV), every 5 years. Some types of HPV increase your risk of cervical cancer. Testing for HPV may also be done on women of any age with unclear Pap test results.  Other health care providers may not recommend any screening for nonpregnant women who are considered low risk for pelvic cancer and who do not have symptoms. Ask your health care provider if a screening pelvic exam is right for you.  If you have had past treatment for cervical cancer or a condition that could lead to cancer, you need Pap tests and screening for cancer for at least 20 years after your treatment. If Pap tests have been discontinued, your risk factors (such as having a new sexual partner) need to be reassessed to determine if screening should resume. Some women have medical problems that increase the chance of getting cervical cancer. In these cases, your health care provider may recommend more frequent screening and Pap tests.  Colorectal Cancer  This type of cancer can be detected and often prevented.  Routine colorectal cancer screening usually begins at 39 years of age and continues through 39 years of age.  Your health care provider may recommend screening at an earlier age if you have risk factors  for colon cancer.  Your health care provider may also recommend using home test kits to check for hidden blood in the stool.  A small camera at the end of a tube can be used to examine your colon directly (sigmoidoscopy or colonoscopy). This is done to check for the earliest forms of colorectal cancer.  Routine screening usually begins at age 50.  Direct examination of the colon should be repeated every 5-10 years through 39 years of age. However, you may need to be screened more often if early forms of precancerous polyps or small growths are found.  Skin Cancer  Check your skin from head to toe regularly.  Tell your health care provider about any new moles or changes in moles, especially if there is a change in a mole's shape or color.  Also tell your health care provider if you have a mole that is larger than the size of a pencil eraser.  Always use sunscreen. Apply sunscreen liberally and repeatedly throughout the day.  Protect yourself by wearing long sleeves, pants, a   wide-brimmed hat, and sunglasses whenever you are outside.  Heart disease, diabetes, and high blood pressure  High blood pressure causes heart disease and increases the risk of stroke. High blood pressure is more likely to develop in: ? People who have blood pressure in the high end of the normal range (130-139/85-89 mm Hg). ? People who are overweight or obese. ? People who are African American.  If you are 54-30 years of age, have your blood pressure checked every 3-5 years. If you are 3 years of age or older, have your blood pressure checked every year. You should have your blood pressure measured twice-once when you are at a hospital or clinic, and once when you are not at a hospital or clinic. Record the average of the two measurements. To check your blood pressure when you are not at a hospital or clinic, you can use: ? An automated blood pressure machine at a pharmacy. ? A home blood pressure monitor.  If  you are between 55 years and 21 years old, ask your health care provider if you should take aspirin to prevent strokes.  Have regular diabetes screenings. This involves taking a blood sample to check your fasting blood sugar level. ? If you are at a normal weight and have a low risk for diabetes, have this test once every three years after 39 years of age. ? If you are overweight and have a high risk for diabetes, consider being tested at a younger age or more often. Preventing infection Hepatitis B  If you have a higher risk for hepatitis B, you should be screened for this virus. You are considered at high risk for hepatitis B if: ? You were born in a country where hepatitis B is common. Ask your health care provider which countries are considered high risk. ? Your parents were born in a high-risk country, and you have not been immunized against hepatitis B (hepatitis B vaccine). ? You have HIV or AIDS. ? You use needles to inject street drugs. ? You live with someone who has hepatitis B. ? You have had sex with someone who has hepatitis B. ? You get hemodialysis treatment. ? You take certain medicines for conditions, including cancer, organ transplantation, and autoimmune conditions.  Hepatitis C  Blood testing is recommended for: ? Everyone born from [birth year] through 1963. ? Anyone with known risk factors for hepatitis C.  Sexually transmitted infections (STIs)  You should be screened for sexually transmitted infections (STIs) including gonorrhea and chlamydia if: ? You are sexually active and are younger than 39 years of age. ? You are older than 39 years of age and your health care provider tells you that you are at risk for this type of infection. ? Your sexual activity has changed since you were last screened and you are at an increased risk for chlamydia or gonorrhea. Ask your health care provider if you are at risk.  If you do not have HIV, but are at risk, it may be recommended  that you take a prescription medicine daily to prevent HIV infection. This is called pre-exposure prophylaxis (PrEP). You are considered at risk if: ? You are sexually active and do not regularly use condoms or know the HIV status of your partner(s). ? You take drugs by injection. ? You are sexually active with a partner who has HIV.  Talk with your health care provider about whether you are at high risk of being infected with HIV. If you choose to  begin PrEP, you should first be tested for HIV. You should then be tested every 3 months for as long as you are taking PrEP. Pregnancy  If you are premenopausal and you may become pregnant, ask your health care provider about preconception counseling.  If you may become pregnant, take 400 to 800 micrograms (mcg) of folic acid every day.  If you want to prevent pregnancy, talk to your health care provider about birth control (contraception). Osteoporosis and menopause  Osteoporosis is a disease in which the bones lose minerals and strength with aging. This can result in serious bone fractures. Your risk for osteoporosis can be identified using a bone density scan.  If you are 65 years of age or older, or if you are at risk for osteoporosis and fractures, ask your health care provider if you should be screened.  Ask your health care provider whether you should take a calcium or vitamin D supplement to lower your risk for osteoporosis.  Menopause may have certain physical symptoms and risks.  Hormone replacement therapy may reduce some of these symptoms and risks. Talk to your health care provider about whether hormone replacement therapy is right for you. Follow these instructions at home:  Schedule regular health, dental, and eye exams.  Stay current with your immunizations.  Do not use any tobacco products including cigarettes, chewing tobacco, or electronic cigarettes.  If you are pregnant, do not drink alcohol.  If you are  breastfeeding, limit how much and how often you drink alcohol.  Limit alcohol intake to no more than 1 drink per day for nonpregnant women. One drink equals 12 ounces of beer, 5 ounces of wine, or 1 ounces of hard liquor.  Do not use street drugs.  Do not share needles.  Ask your health care provider for help if you need support or information about quitting drugs.  Tell your health care provider if you often feel depressed.  Tell your health care provider if you have ever been abused or do not feel safe at home. This information is not intended to replace advice given to you by your health care provider. Make sure you discuss any questions you have with your health care provider. Document Released: 01/14/2011 Document Revised: 12/07/2015 Document Reviewed: 04/04/2015 Elsevier Interactive Patient Education  2018 [employer]  

## 2017-05-03 ENCOUNTER — Other Ambulatory Visit: Payer: Self-pay | Admitting: Obstetrics and Gynecology

## 2017-07-25 ENCOUNTER — Other Ambulatory Visit: Payer: Self-pay

## 2017-07-25 DIAGNOSIS — Z299 Encounter for prophylactic measures, unspecified: Secondary | ICD-10-CM

## 2017-07-25 NOTE — Progress Notes (Signed)
Patient came in to have blood drawn for testing per Dr. Sherlynn StallsAiken's orders.

## 2017-07-26 LAB — CMP12+LP+TP+TSH+6AC+CBC/D/PLT
A/G RATIO: 1.5 (ref 1.2–2.2)
ALBUMIN: 4.1 g/dL (ref 3.5–5.5)
ALT: 31 IU/L (ref 0–32)
AST: 25 IU/L (ref 0–40)
Alkaline Phosphatase: 86 IU/L (ref 39–117)
BUN/Creatinine Ratio: 10 (ref 9–23)
BUN: 8 mg/dL (ref 6–20)
Basophils Absolute: 0 10*3/uL (ref 0.0–0.2)
Basos: 0 %
Bilirubin Total: 0.3 mg/dL (ref 0.0–1.2)
CALCIUM: 9.5 mg/dL (ref 8.7–10.2)
CHOL/HDL RATIO: 5.3 ratio — AB (ref 0.0–4.4)
CHOLESTEROL TOTAL: 165 mg/dL (ref 100–199)
Chloride: 108 mmol/L — ABNORMAL HIGH (ref 96–106)
Creatinine, Ser: 0.77 mg/dL (ref 0.57–1.00)
EOS (ABSOLUTE): 0.2 10*3/uL (ref 0.0–0.4)
Eos: 3 %
Estimated CHD Risk: 1.4 times avg. — ABNORMAL HIGH (ref 0.0–1.0)
Free Thyroxine Index: 2.1 (ref 1.2–4.9)
GFR calc Af Amer: 112 mL/min/{1.73_m2} (ref >59)
GFR, EST NON AFRICAN AMERICAN: 98 mL/min/{1.73_m2} (ref >59)
GGT: 24 IU/L (ref 0–60)
GLOBULIN, TOTAL: 2.8 g/dL (ref 1.5–4.5)
Glucose: 97 mg/dL (ref 65–99)
HDL: 31 mg/dL — ABNORMAL LOW (ref >39)
Hematocrit: 39.1 % (ref 34.0–46.6)
Hemoglobin: 13 g/dL (ref 11.1–15.9)
IRON: 72 ug/dL (ref 27–159)
Immature Grans (Abs): 0 10*3/uL (ref 0.0–0.1)
Immature Granulocytes: 0 %
LDH: 138 IU/L (ref 119–226)
LDL Calculated: 98 mg/dL (ref 0–99)
LYMPHS: 23 %
Lymphocytes Absolute: 1.9 10*3/uL (ref 0.7–3.1)
MCH: 28 pg (ref 26.6–33.0)
MCHC: 33.2 g/dL (ref 31.5–35.7)
MCV: 84 fL (ref 79–97)
MONOS ABS: 0.6 10*3/uL (ref 0.1–0.9)
Monocytes: 7 %
NEUTROS ABS: 5.5 10*3/uL (ref 1.4–7.0)
Neutrophils: 67 %
PHOSPHORUS: 4.1 mg/dL (ref 2.5–4.5)
PLATELETS: 369 10*3/uL (ref 150–379)
POTASSIUM: 5 mmol/L (ref 3.5–5.2)
RBC: 4.65 x10E6/uL (ref 3.77–5.28)
RDW: 13.9 % (ref 12.3–15.4)
Sodium: 142 mmol/L (ref 134–144)
T3 UPTAKE RATIO: 29 % (ref 24–39)
T4 TOTAL: 7.2 ug/dL (ref 4.5–12.0)
TOTAL PROTEIN: 6.9 g/dL (ref 6.0–8.5)
TRIGLYCERIDES: 181 mg/dL — AB (ref 0–149)
TSH: 2.02 u[IU]/mL (ref 0.450–4.500)
Uric Acid: 4.8 mg/dL (ref 2.5–7.1)
VLDL Cholesterol Cal: 36 mg/dL (ref 5–40)
WBC: 8.3 10*3/uL (ref 3.4–10.8)

## 2017-07-26 LAB — LITHIUM LEVEL: LITHIUM LVL: 0.8 mmol/L (ref 0.6–1.2)

## 2017-07-26 LAB — B12 AND FOLATE PANEL
Folate: 6.1 ng/mL (ref >3.0)
VITAMIN B 12: 1147 pg/mL (ref 232–1245)

## 2017-07-26 LAB — VITAMIN D 25 HYDROXY (VIT D DEFICIENCY, FRACTURES): Vit D, 25-Hydroxy: 28.7 ng/mL — ABNORMAL LOW (ref 30.0–100.0)

## 2017-07-26 LAB — HGB A1C W/O EAG: Hgb A1c MFr Bld: 5.3 % (ref 4.8–5.6)

## 2017-10-10 ENCOUNTER — Ambulatory Visit: Payer: Self-pay | Admitting: Family Medicine

## 2017-10-10 ENCOUNTER — Encounter: Payer: Self-pay | Admitting: Family Medicine

## 2017-10-10 VITALS — BP 122/82 | HR 86 | Temp 99.1°F | Resp 20 | Ht 66.0 in | Wt 296.0 lb

## 2017-10-10 DIAGNOSIS — Z0189 Encounter for other specified special examinations: Principal | ICD-10-CM

## 2017-10-10 DIAGNOSIS — Z008 Encounter for other general examination: Secondary | ICD-10-CM

## 2017-10-10 LAB — GLUCOSE, POCT (MANUAL RESULT ENTRY): POC Glucose: 98 mg/dl (ref 70–99)

## 2017-10-10 NOTE — Progress Notes (Signed)
Subjective: Annual biometrics screening  Patient presents for her annual biometric screening. Patient reports her only medical problems are mental health issues, which are stable according to the patient.  Denies any other medical problems.  Patient does not currently have a primary care provider.  Patient denies any symptoms or complaints today. Patient works for [employer]. Patient denies any other issues or concerns.   Review of Systems Constitutional: Unremarkable.  HEENT: Unremarkable Gastrointestinal: Unremarkable Respiratory: Unremarkable.   Cardiovascular: Unremarkable.  ROS otherwise negative.   Objective  Physical Exam General: Awake, alert and oriented. No acute distress. Well developed, hydrated and nourished. Appears stated age.  HEENT:  Supple neck without adenopathy. Sclera is non-icteric. The ear canal is clear without discharge. The tympanic membrane is normal in appearance with normal landmarks and cone of light. Nasal mucosa is pink and moist. Oral mucosa is pink and moist. The pharynx is normal in appearance without tonsillar swelling or exudates.  Skin: Skin in warm, dry and intact without rashes or lesions. Appropriate color for ethnicity. Cardiac: Heart rate and rhythm are normal. No murmurs, gallops, or rubs are auscultated.  Respiratory: The chest wall is symmetric and without deformity. No signs of respiratory distress. Lung sounds are clear in all lobes bilaterally without rales, ronchi, or wheezes.  Neurological: The patient is awake, alert and oriented to person, place, and time with normal speech.  Memory is normal and thought processes intact. No gait abnormalities are appreciated.  Psychiatric: Appropriate mood and affect.   Assessment Annual biometrics screen  Plan  Labs pending. Encouraged routine visits with primary care provider.  Provided patient with resources to establish care with a new primary care provider. Fasting blood sugar is 98  today.

## 2017-10-11 LAB — LIPID PANEL
Chol/HDL Ratio: 5.3 ratio — ABNORMAL HIGH (ref 0.0–4.4)
Cholesterol, Total: 185 mg/dL (ref 100–199)
HDL: 35 mg/dL — ABNORMAL LOW
LDL Calculated: 127 mg/dL — ABNORMAL HIGH (ref 0–99)
Triglycerides: 117 mg/dL (ref 0–149)
VLDL Cholesterol Cal: 23 mg/dL (ref 5–40)

## 2017-10-13 NOTE — Progress Notes (Signed)
Dear Krystal Acosta, I wanted to let you know that your lipid panel came back.  Your total cholesterol is 185, normal values are between 100 and 199.  Your triglyceride level is 117, normal values are between 0 and 149.  Your VLDL cholesterol is 23, normal values are between 5 and 40. Your HDL cholesterol ("good cholesterol") is 35, normal values are above 39.  Your LDL cholesterol ("bad cholesterol") is 127, normal values are below 99.  Your cholesterol/HDL ratio is 5.3, normal values are between 0 and 4.4 for women or 0 and 5 from men.  These abnormal values increase your risk for cardiovascular disease.  I wanted you to be aware of these results so that you can discuss this with your primary care provider at your next regularly scheduled visit.

## 2018-12-21 ENCOUNTER — Encounter: Payer: Self-pay | Admitting: Adult Health

## 2018-12-21 ENCOUNTER — Other Ambulatory Visit: Payer: Self-pay

## 2018-12-21 ENCOUNTER — Ambulatory Visit: Payer: Managed Care, Other (non HMO) | Admitting: Adult Health

## 2018-12-21 VITALS — BP 128/80 | HR 60 | Temp 98.4°F | Resp 18

## 2018-12-21 DIAGNOSIS — M545 Low back pain, unspecified: Secondary | ICD-10-CM

## 2018-12-21 DIAGNOSIS — D510 Vitamin B12 deficiency anemia due to intrinsic factor deficiency: Secondary | ICD-10-CM | POA: Insufficient documentation

## 2018-12-21 DIAGNOSIS — N6019 Diffuse cystic mastopathy of unspecified breast: Secondary | ICD-10-CM | POA: Insufficient documentation

## 2018-12-21 DIAGNOSIS — S39012A Strain of muscle, fascia and tendon of lower back, initial encounter: Secondary | ICD-10-CM

## 2018-12-21 DIAGNOSIS — G44209 Tension-type headache, unspecified, not intractable: Secondary | ICD-10-CM | POA: Insufficient documentation

## 2018-12-21 DIAGNOSIS — F32A Depression, unspecified: Secondary | ICD-10-CM | POA: Insufficient documentation

## 2018-12-21 DIAGNOSIS — F259 Schizoaffective disorder, unspecified: Secondary | ICD-10-CM | POA: Insufficient documentation

## 2018-12-21 DIAGNOSIS — F329 Major depressive disorder, single episode, unspecified: Secondary | ICD-10-CM | POA: Insufficient documentation

## 2018-12-21 DIAGNOSIS — E669 Obesity, unspecified: Secondary | ICD-10-CM | POA: Insufficient documentation

## 2018-12-21 LAB — POCT URINALYSIS DIPSTICK
Bilirubin, UA: NEGATIVE
Glucose, UA: NEGATIVE
Ketones, UA: NEGATIVE
Leukocytes, UA: NEGATIVE
Nitrite, UA: NEGATIVE
Protein, UA: NEGATIVE
Spec Grav, UA: 1.02 (ref 1.010–1.025)
Urobilinogen, UA: 0.2 E.U./dL
pH, UA: 6 (ref 5.0–8.0)

## 2018-12-21 MED ORDER — CYCLOBENZAPRINE HCL 10 MG PO TABS: 10.0000 mg | ORAL_TABLET | Freq: Three times a day (TID) | ORAL | 0 refills | Status: AC | PRN

## 2018-12-21 MED ORDER — PREDNISONE 10 MG (21) PO TBPK: ORAL_TABLET | ORAL | 0 refills | Status: AC

## 2018-12-21 NOTE — Progress Notes (Signed)
Subjective:     Patient ID: Krystal Acosta, female   DOB: 09-17-1975, 43 y.o.   MRN: [id number]  Blood pressure 128/80, pulse 60, temperature 98.4 F (36.9 C), temperature source Oral, resp. rate 18, SpO2 97 %. Back Pain  This is a chronic problem. The current episode started in the past 7 days (this past thursday ). The problem occurs intermittently. The problem has been gradually worsening since onset. The pain is present in the lumbar spine and gluteal (radiates to right hip ). The quality of the pain is described as shooting. Radiates to: radiates to hip  The pain is at a severity of 3/10 (with movement ). The pain is moderate. The pain is the same all the time. The symptoms are aggravated by standing (movement ). Stiffness is present all day. Pertinent negatives include no abdominal pain, bladder incontinence, bowel incontinence, chest pain, dysuria, fever, headaches, leg pain, numbness, paresis, paresthesias, pelvic pain, perianal numbness, tingling, weakness or weight loss. Risk factors include lack of exercise and sedentary lifestyle ( 1/2 walks and half sits ). She has tried NSAIDs (aleve over the counter once every twelve hours. ) for the symptoms. The treatment provided mild relief.    She reports she has taken muscle relaxer's in the past with relief.  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 She denies any broken bones.   She reports this pain is muscular like she has had in times pain. She feels the most relief when she lies down.  She has seen [facility] - Dr. Yetta BarreJones   Denies any groin numbness or loss of bowel or badder control. She reports she has chronically sprained lower back in past. Denies any fractures.  Denies any injury or trauma.  She was planting cucumbers and feels and  she feels she pulled something doing that.  Denies any new injury.  Denies any abdominal pain, denies pelvic pain.   Review of Systems   Constitutional: Negative for fever and weight loss.  Cardiovascular: Negative for chest pain.  Gastrointestinal: Negative for abdominal pain and bowel incontinence.  Genitourinary: Negative for bladder incontinence, dysuria and pelvic pain.  Musculoskeletal: Positive for back pain.  Neurological: Negative for tingling, weakness, numbness, headaches and paresthesias.       Objective:   Physical Exam Vitals signs reviewed.  Constitutional:      General: She is not in acute distress.    Appearance: Normal appearance. She is not ill-appearing, toxic-appearing or diaphoretic.  HENT:     Head: Normocephalic and atraumatic.     Right Ear: There is no impacted cerumen.     Left Ear: There is no impacted cerumen.     Nose: Nose normal. No congestion or rhinorrhea.     Mouth/Throat:     Mouth: Mucous membranes are moist.     Pharynx: No oropharyngeal exudate or posterior oropharyngeal erythema.  Eyes:     General: No scleral icterus.       Right eye: No discharge.        Left eye: No discharge.     Conjunctiva/sclera: Conjunctivae normal.     Pupils: Pupils are equal, round, and reactive to light.  Neck:     Musculoskeletal: Normal range of motion and neck supple. No neck rigidity or muscular tenderness.  Cardiovascular:     Rate and Rhythm: Normal rate and regular rhythm.     Pulses: Normal pulses.  Heart sounds: Normal heart sounds. No murmur. No friction rub. No gallop.   Pulmonary:     Effort: Pulmonary effort is normal. No respiratory distress.     Breath sounds: No stridor. No wheezing, rhonchi or rales.  Chest:     Chest wall: No tenderness.  Abdominal:     General: There is no distension.     Palpations: Abdomen is soft. There is no mass.     Tenderness: There is no abdominal tenderness. There is no right CVA tenderness, left CVA tenderness, guarding or rebound.     Hernia: No hernia is present.  Musculoskeletal: Normal range of motion.        General: No swelling,  tenderness, deformity or signs of injury.     Right shoulder: She exhibits pain and spasm. She exhibits normal range of motion, no tenderness, no bony tenderness, no swelling, no effusion, no crepitus, no deformity, no laceration, normal pulse and normal strength.       Arms:     Right lower leg: No edema.     Left lower leg: No edema.     Comments: Lumbar sacral muscle tightness   Lymphadenopathy:     Cervical: No cervical adenopathy.  Skin:    General: Skin is warm.     Capillary Refill: Capillary refill takes less than 2 seconds.     Findings: No rash.  Neurological:     General: No focal deficit present.     Mental Status: She is alert.     Cranial Nerves: No cranial nerve deficit.     Sensory: No sensory deficit.     Motor: No weakness.     Coordination: Coordination normal.     Gait: Gait normal.     Deep Tendon Reflexes: Reflexes normal.  Psychiatric:        Mood and Affect: Mood normal.        Behavior: Behavior normal.        Thought Content: Thought content normal.        Judgment: Judgment normal.        Assessment:     Acute right-sided low back pain without sciatica - Plan: POCT Urinalysis Dipstick (CPT 81002), UA/M w/rflx Culture, Comp, DG Lumbar Spine Complete, DG Hip Unilat W OR W/O Pelvis 2-3 Views Right, CANCELED: Urinalysis, Routine w reflex microscopic, CANCELED: Urine Culture  Strain of lumbar region, initial encounter   Plan:     . Krystal Acosta was seen today for back pain.  Diagnoses and all orders for this visit:  Acute right-sided low back pain without sciatica -     POCT Urinalysis Dipstick (CPT 81002) -     Cancel: Urinalysis, Routine w reflex microscopic -     Cancel: Urine Culture -     UA/M w/rflx Culture, Comp -     DG Lumbar Spine Complete; Future -     DG Hip Unilat W OR W/O Pelvis 2-3 Views Right; Future  Strain of lumbar region, initial encounter  Other orders -     cyclobenzaprine (FLEXERIL) 10 MG tablet; Take 1 tablet (10 mg  total) by mouth 3 (three) times daily as needed (will make you drowsy). -     predniSONE (STERAPRED UNI-PAK 21 TAB) 10 MG (21) TBPK tablet; PO: Take 6 tablets on day 1:Take 5 tablets day 2:Take 4 tablets day 3: Take 3 tablets day 4:Take 2 tablets day five: 5 Take 1 tablet day 6   She is advised to follow up with  [facility] -  this is where she has had orthopedic care for the same symptoms. Walk in Clinic is from 1pm-7pm Monday to Friday. She should follow up with them in one week.   Advised patient call the office or your primary care doctor for an appointment if no improvement within 72 hours or if any symptoms change or worsen at any time  Advised ER or urgent Care if after hours or on weekend. Call 911 for emergency symptoms at any time.Patinet verbalized understanding of all instructions given/reviewed and treatment plan and has no further questions or concerns at this time.    Patient verbalized understanding of all instructions given and denies any further questions at this time.

## 2018-12-21 NOTE — Patient Instructions (Signed)
Cyclobenzaprine tablets What is this medicine? CYCLOBENZAPRINE (sye kloe BEN za preen) is a muscle relaxer. It is used to treat muscle pain, spasms, and stiffness. This medicine may be used for other purposes; ask your health care provider or pharmacist if you have questions. COMMON BRAND NAME(S): Fexmid, Flexeril What should I tell my health care provider before I take this medicine? They need to know if you have any of these conditions: -heart disease, irregular heartbeat, or previous heart attack -liver disease -thyroid problem -an unusual or allergic reaction to cyclobenzaprine, tricyclic antidepressants, lactose, other medicines, foods, dyes, or preservatives -pregnant or trying to get pregnant -breast-feeding How should I use this medicine? Take this medicine by mouth with a glass of water. Follow the directions on the prescription label. If this medicine upsets your stomach, take it with food or milk. Take your medicine at regular intervals. Do not take it more often than directed. Talk to your pediatrician regarding the use of this medicine in children. Special care may be needed. Overdosage: If you think you have taken too much of this medicine contact a poison control center or emergency room at once. NOTE: This medicine is only for you. Do not share this medicine with others. What if I miss a dose? If you miss a dose, take it as soon as you can. If it is almost time for your next dose, take only that dose. Do not take double or extra doses. What may interact with this medicine? Do not take this medicine with any of the following medications: -MAOIs like Carbex, Eldepryl, Marplan, Nardil, and Parnate This medicine may also interact with the following medications: -alcohol -antihistamines for allergy, cough, and cold -certain medicines for anxiety or sleep -certain medicines for depression like amitriptyline, fluoxetine, sertraline -certain medicines for seizures like  phenobarbital, primidone -contrast dyes -local anesthetics like lidocaine, pramoxine, tetracaine -medicines that relax muscles for surgery -narcotic medicines for pain -phenothiazines like chlorpromazine, mesoridazine, prochlorperazine This list may not describe all possible interactions. Give your health care provider a list of all the medicines, herbs, non-prescription drugs, or dietary supplements you use. Also tell them if you smoke, drink alcohol, or use illegal drugs. Some items may interact with your medicine. What should I watch for while using this medicine? Tell your doctor or health care professional if your symptoms do not start to get better or if they get worse. You may get drowsy or dizzy. Do not drive, use machinery, or do anything that needs mental alertness until you know how this medicine affects you. Do not stand or sit up quickly, especially if you are an older patient. This reduces the risk of dizzy or fainting spells. Alcohol may interfere with the effect of this medicine. Avoid alcoholic drinks. If you are taking another medicine that also causes drowsiness, you may have more side effects. Give your health care provider a list of all medicines you use. Your doctor will tell you how much medicine to take. Do not take more medicine than directed. Call emergency for help if you have problems breathing or unusual sleepiness. Your mouth may get dry. Chewing sugarless gum or sucking hard candy, and drinking plenty of water may help. Contact your doctor if the problem does not go away or is severe. What side effects may I notice from receiving this medicine? Side effects that you should report to your doctor or health care professional as soon as possible: -allergic reactions like skin rash, itching or hives, swelling of the face,  lips, or tongue -breathing problems -chest pain -fast, irregular heartbeat -hallucinations -seizures -unusually weak or tired Side effects that  usually do not require medical attention (report to your doctor or health care professional if they continue or are bothersome): -headache -nausea, vomiting This list may not describe all possible side effects. Call your doctor for medical advice about side effects. You may report side effects to FDA at 1-800-FDA-1088. Where should I keep my medicine? Keep out of the reach of children. Store at room temperature between 15 and 30 degrees C (59 and 86 degrees F). Keep container tightly closed. Throw away any unused medicine after the expiration date. NOTE: This sheet is a summary. It may not cover all possible information. If you have questions about this medicine, talk to your doctor, pharmacist, or health care provider.  2019 Elsevier/Gold Standard (2017-04-23 13:04:35) Prednisone tablets What is this medicine? PREDNISONE (PRED ni sone) is a corticosteroid. It is commonly used to treat inflammation of the skin, joints, lungs, and other organs. Common conditions treated include asthma, allergies, and arthritis. It is also used for other conditions, such as blood disorders and diseases of the adrenal glands. This medicine may be used for other purposes; ask your health care provider or pharmacist if you have questions. COMMON BRAND NAME(S): Deltasone, Predone, Sterapred, Sterapred DS What should I tell my health care provider before I take this medicine? They need to know if you have any of these conditions: -Cushing's syndrome -diabetes -glaucoma -heart disease -high blood pressure -infection (especially a virus infection such as chickenpox, cold sores, or herpes) -kidney disease -liver disease -mental illness -myasthenia gravis -osteoporosis -seizures -stomach or intestine problems -thyroid disease -an unusual or allergic reaction to lactose, prednisone, other medicines, foods, dyes, or preservatives -pregnant or trying to get pregnant -breast-feeding How should I use this  medicine? Take this medicine by mouth with a glass of water. Follow the directions on the prescription label. Take this medicine with food. If you are taking this medicine once a day, take it in the morning. Do not take more medicine than you are told to take. Do not suddenly stop taking your medicine because you may develop a severe reaction. Your doctor will tell you how much medicine to take. If your doctor wants you to stop the medicine, the dose may be slowly lowered over time to avoid any side effects. Talk to your pediatrician regarding the use of this medicine in children. Special care may be needed. Overdosage: If you think you have taken too much of this medicine contact a poison control center or emergency room at once. NOTE: This medicine is only for you. Do not share this medicine with others. What if I miss a dose? If you miss a dose, take it as soon as you can. If it is almost time for your next dose, talk to your doctor or health care professional. You may need to miss a dose or take an extra dose. Do not take double or extra doses without advice. What may interact with this medicine? Do not take this medicine with any of the following medications: -metyrapone -mifepristone This medicine may also interact with the following medications: -aminoglutethimide -amphotericin B -aspirin and aspirin-like medicines -barbiturates -certain medicines for diabetes, like glipizide or glyburide -cholestyramine -cholinesterase inhibitors -cyclosporine -digoxin -diuretics -ephedrine -female hormones, like estrogens and birth control pills -isoniazid -ketoconazole -NSAIDS, medicines for pain and inflammation, like ibuprofen or naproxen -phenytoin -rifampin -toxoids -vaccines -warfarin This list may not describe all possible  interactions. Give your health care provider a list of all the medicines, herbs, non-prescription drugs, or dietary supplements you use. Also tell them if you smoke,  drink alcohol, or use illegal drugs. Some items may interact with your medicine. What should I watch for while using this medicine? Visit your doctor or health care professional for regular checks on your progress. If you are taking this medicine over a prolonged period, carry an identification card with your name and address, the type and dose of your medicine, and your doctor's name and address. This medicine may increase your risk of getting an infection. Tell your doctor or health care professional if you are around anyone with measles or chickenpox, or if you develop sores or blisters that do not heal properly. If you are going to have surgery, tell your doctor or health care professional that you have taken this medicine within the last twelve months. Ask your doctor or health care professional about your diet. You may need to lower the amount of salt you eat. This medicine may increase blood sugar. Ask your healthcare provider if changes in diet or medicines are needed if you have diabetes. What side effects may I notice from receiving this medicine? Side effects that you should report to your doctor or health care professional as soon as possible: -allergic reactions like skin rash, itching or hives, swelling of the face, lips, or tongue -changes in emotions or moods -changes in vision -depressed mood -eye pain -fever or chills, cough, sore throat, pain or difficulty passing urine -signs and symptoms of high blood sugar such as being more thirsty or hungry or having to urinate more than normal. You may also feel very tired or have blurry vision. -swelling of ankles, feet Side effects that usually do not require medical attention (report to your doctor or health care professional if they continue or are bothersome): -confusion, excitement, restlessness -headache -nausea, vomiting -skin problems, acne, thin and shiny skin -trouble sleeping -weight gain This list may not describe all  possible side effects. Call your doctor for medical advice about side effects. You may report side effects to FDA at 1-800-FDA-1088. Where should I keep my medicine? Keep out of the reach of children. Store at room temperature between 15 and 30 degrees C (59 and 86 degrees F). Protect from light. Keep container tightly closed. Throw away any unused medicine after the expiration date. NOTE: This sheet is a summary. It may not cover all possible information. If you have questions about this medicine, talk to your doctor, pharmacist, or health care provider.  2019 Elsevier/Gold Standard (2018-03-31 10:54:22) Low Back Sprain Rehab Ask your health care provider which exercises are safe for you. Do exercises exactly as told by your health care provider and adjust them as directed. It is normal to feel mild stretching, pulling, tightness, or discomfort as you do these exercises, but you should stop right away if you feel sudden pain or your pain gets worse. Do not begin these exercises until told by your health care provider. Stretching and range of motion exercises These exercises warm up your muscles and joints and improve the movement and flexibility of your back. These exercises also help to relieve pain, numbness, and tingling. Exercise A: Lumbar rotation  1. Lie on your back on a firm surface and bend your knees. 2. Straighten your arms out to your sides so each arm forms an "L" shape with a side of your body (a 90 degree angle). 3. Slowly move  both of your knees to one side of your body until you feel a stretch in your lower back. Try not to let your shoulders move off of the floor. 4. Hold for __________ seconds. 5. Tense your abdominal muscles and slowly move your knees back to the starting position. 6. Repeat this exercise on the other side of your body. Repeat __________ times. Complete this exercise __________ times a day. Exercise B: Prone extension on elbows  1. Lie on your abdomen on a  firm surface. 2. Prop yourself up on your elbows. 3. Use your arms to help lift your chest up until you feel a gentle stretch in your abdomen and your lower back. ? This will place some of your body weight on your elbows. If this is uncomfortable, try stacking pillows under your chest. ? Your hips should stay down, against the surface that you are lying on. Keep your hip and back muscles relaxed. 4. Hold for __________ seconds. 5. Slowly relax your upper body and return to the starting position. Repeat __________ times. Complete this exercise __________ times a day. Strengthening exercises These exercises build strength and endurance in your back. Endurance is the ability to use your muscles for a long time, even after they get tired. Exercise C: Pelvic tilt 1. Lie on your back on a firm surface. Bend your knees and keep your feet flat. 2. Tense your abdominal muscles. Tip your pelvis up toward the ceiling and flatten your lower back into the floor. ? To help with this exercise, you may place a small towel under your lower back and try to push your back into the towel. 3. Hold for __________ seconds. 4. Let your muscles relax completely before you repeat this exercise. Repeat __________ times. Complete this exercise __________ times a day. Exercise D: Alternating arm and leg raises  1. Get on your hands and knees on a firm surface. If you are on a hard floor, you may want to use padding to cushion your knees, such as an exercise mat. 2. Line up your arms and legs. Your hands should be below your shoulders, and your knees should be below your hips. 3. Lift your left leg behind you. At the same time, raise your right arm and straighten it in front of you. ? Do not lift your leg higher than your hip. ? Do not lift your arm higher than your shoulder. ? Keep your abdominal and back muscles tight. ? Keep your hips facing the ground. ? Do not arch your back. ? Keep your balance carefully, and do  not hold your breath. 4. Hold for __________ seconds. 5. Slowly return to the starting position and repeat with your right leg and your left arm. Repeat __________ times. Complete this exercise __________ times a day. Exercise E: Abdominal set with straight leg raise  1. Lie on your back on a firm surface. 2. Bend one of your knees and keep your other leg straight. 3. Tense your abdominal muscles and lift your straight leg up, 4-6 inches (10-15 cm) off the ground. 4. Keep your abdominal muscles tight and hold for __________ seconds. ? Do not hold your breath. ? Do not arch your back. Keep it flat against the ground. 5. Keep your abdominal muscles tense as you slowly lower your leg back to the starting position. 6. Repeat with your other leg. Repeat __________ times. Complete this exercise __________ times a day. Posture and body mechanics  Body mechanics refers to the movements and  positions of your body while you do your daily activities. Posture is part of body mechanics. Good posture and healthy body mechanics can help to relieve stress in your body's tissues and joints. Good posture means that your spine is in its natural S-curve position (your spine is neutral), your shoulders are pulled back slightly, and your head is not tipped forward. The following are general guidelines for applying improved posture and body mechanics to your everyday activities. Standing   When standing, keep your spine neutral and your feet about hip-width apart. Keep a slight bend in your knees. Your ears, shoulders, and hips should line up.  When you do a task in which you stand in one place for a long time, place one foot up on a stable object that is 2-4 inches (5-10 cm) high, such as a footstool. This helps keep your spine neutral. Sitting   When sitting, keep your spine neutral and keep your feet flat on the floor. Use a footrest, if necessary, and keep your thighs parallel to the floor. Avoid rounding  your shoulders, and avoid tilting your head forward.  When working at a desk or a computer, keep your desk at a height where your hands are slightly lower than your elbows. Slide your chair under your desk so you are close enough to maintain good posture.  When working at a computer, place your monitor at a height where you are looking straight ahead and you do not have to tilt your head forward or downward to look at the screen. Resting   When lying down and resting, avoid positions that are most painful for you.  If you have pain with activities such as sitting, bending, stooping, or squatting (flexion-based activities), lie in a position in which your body does not bend very much. For example, avoid curling up on your side with your arms and knees near your chest (fetal position).  If you have pain with activities such as standing for a long time or reaching with your arms (extension-based activities), lie with your spine in a neutral position and bend your knees slightly. Try the following positions:  Lying on your side with a pillow between your knees.  Lying on your back with a pillow under your knees. Lifting   When lifting objects, keep your feet at least shoulder-width apart and tighten your abdominal muscles.  Bend your knees and hips and keep your spine neutral. It is important to lift using the strength of your legs, not your back. Do not lock your knees straight out.  Always ask for help to lift heavy or awkward objects. This information is not intended to replace advice given to you by your health care provider. Make sure you discuss any questions you have with your health care provider. Document Released: 07/01/2005 Document Revised: 03/07/2016 Document Reviewed: 04/12/2015 Elsevier Interactive Patient Education  2019 [employer].

## 2018-12-24 ENCOUNTER — Telehealth: Payer: Self-pay | Admitting: Adult Health

## 2018-12-24 DIAGNOSIS — R3121 Asymptomatic microscopic hematuria: Secondary | ICD-10-CM

## 2018-12-24 DIAGNOSIS — R82998 Other abnormal findings in urine: Secondary | ICD-10-CM

## 2018-12-24 DIAGNOSIS — N390 Urinary tract infection, site not specified: Secondary | ICD-10-CM

## 2018-12-24 LAB — UA/M W/RFLX CULTURE, COMP
Bilirubin, UA: NEGATIVE
Glucose, UA: NEGATIVE
Ketones, UA: NEGATIVE
Nitrite, UA: NEGATIVE
Protein,UA: NEGATIVE
RBC, UA: NEGATIVE
Specific Gravity, UA: 1.018 (ref 1.005–1.030)
Urobilinogen, Ur: 0.2 mg/dL (ref 0.2–1.0)
pH, UA: 6 (ref 5.0–7.5)

## 2018-12-24 LAB — MICROSCOPIC EXAMINATION: Casts: NONE SEEN /lpf

## 2018-12-24 LAB — URINE CULTURE, COMPREHENSIVE

## 2018-12-24 MED ORDER — AMOXICILLIN-POT CLAVULANATE 875-125 MG PO TABS: 1.0000 | ORAL_TABLET | Freq: Two times a day (BID) | ORAL | 0 refills | Status: AC

## 2018-12-24 NOTE — Progress Notes (Signed)
Called in Augmentin. Spoke with patient by phone. Advised two week recheck after finishing antibiotics to be sure hematuria and bacteria have cleared.  Advised patient call the office or your primary care doctor for an appointment if no improvement within 72 hours or if any symptoms change or worsen at any time  Advised ER or urgent Care if after hours or on weekend. Call 911 for emergency symptoms at any time.Patinet verbalized understanding of all instructions given/reviewed and treatment plan and has no further questions or concerns at this time.

## 2018-12-24 NOTE — Telephone Encounter (Signed)
Patient was called with urine culture as below.  She had trace leukocytes  and small blood on microscopic   Allergies  Allergen Reactions  . Hydrocodone-Acetaminophen Itching  . Hydrocodone-Acetaminophen Itching   Allergies verified. She reports her back pain is feeling much better since office visit. She has no urinary symptoms.  Will prescribe Augmentin as below to [place] - she reports she has taken before without any difficulties. She denies any chance of pregnancy.  Meds ordered this encounter  Medications  . amoxicillin-clavulanate (AUGMENTIN) 875-125 MG tablet    Sig: Take 1 tablet by mouth 2 (two) times daily for 7 days.    Dispense:  14 tablet    Refill:  0   She is advised to take antibiotic as above and return by calling for a appointment  to the office for a two week recheck of urine with microscopy to be sure urine has no hematuria.   I have already ordered this as a future order in the computer.  Orders Placed This Encounter  Procedures  . Urinalysis, Routine w reflex microscopic    Standing Status:   Future    Standing Expiration Date:   01/23/2019    Patient verbalized understanding of all instructions given and denies any further questions at this time.    Urine Culture, Comprehensive Order: [id number] - Reflex for Order [id number] Status:  Final result Visible to patient:  No (not released) Next appt:  None  URINE     Component 3d ago  Urine Culture, Comprehensive Final reportAbnormal    Organism ID, Bacteria Proteus mirabilisAbnormal    Comment: Greater than 100,000 colony forming units per mL  Cefazolin with an MIC <=16 predicts susceptibility to the oral agents  cefaclor, cefdinir, cefpodoxime, cefprozil, cefuroxime, cephalexin,  and loracarbef when used for therapy of uncomplicated urinary tract  infections due to E. coli, Klebsiella pneumoniae, and Proteus  mirabilis.   Organism ID, Bacteria Comment   Comment: Mixed urogenital flora   50,000-100,000 colony forming units per mL   ANTIMICROBIAL SUSCEPTIBILITY Comment   Comment:   ** S = Susceptible; I = Intermediate; R = Resistant **           P = Positive; N = Negative        MICS are expressed in micrograms per mL   Antibiotic         RSLT#1  RSLT#2  RSLT#3  RSLT#4  Amoxicillin/Clavulanic Acid  S  Ampicillin           S

## 2019-02-01 ENCOUNTER — Encounter: Payer: Self-pay | Admitting: Adult Health

## 2019-02-01 ENCOUNTER — Other Ambulatory Visit: Payer: Self-pay

## 2019-02-01 ENCOUNTER — Ambulatory Visit: Payer: Managed Care, Other (non HMO) | Admitting: Adult Health

## 2019-02-01 DIAGNOSIS — Z20822 Contact with and (suspected) exposure to covid-19: Secondary | ICD-10-CM

## 2019-02-01 DIAGNOSIS — J029 Acute pharyngitis, unspecified: Secondary | ICD-10-CM | POA: Diagnosis not present

## 2019-02-01 DIAGNOSIS — J069 Acute upper respiratory infection, unspecified: Secondary | ICD-10-CM

## 2019-02-01 DIAGNOSIS — R6889 Other general symptoms and signs: Secondary | ICD-10-CM | POA: Diagnosis not present

## 2019-02-01 DIAGNOSIS — R0981 Nasal congestion: Secondary | ICD-10-CM

## 2019-02-01 MED ORDER — CLARITHROMYCIN 500 MG PO TABS: 500.0000 mg | ORAL_TABLET | Freq: Two times a day (BID) | ORAL | 0 refills | Status: AC

## 2019-02-01 NOTE — Patient Instructions (Signed)
Clarithromycin tablets What is this medicine? CLARITHROMYCIN (kla RITH roe mye sin) is a macrolide antibiotic. It is used to treat or prevent certain kinds of bacterial infections. It will not work for colds, flu, or other viral infections. This medicine may be used for other purposes; ask your health care provider or pharmacist if you have questions. COMMON BRAND NAME(S): Biaxin What should I tell my health care provider before I take this medicine? They need to know if you have any of these conditions:  heart disease  history of irregular heartbeat  kidney disease  liver disease  myasthenia gravis  an unusual or allergic reaction to clarithromycin, other macrolide antibiotics, other medicines, foods, dyes, or preservatives  pregnant or trying to get pregnant  breast-feeding How should I use this medicine? Take this medicine by mouth with glass of water. If it upsets your stomach you can take it with milk or food. Follow the directions on the prescription label. Take your medicine at regular intervals. Do not take your medicine more often than directed. Take all of your medicine as directed even if you think your are better. Do not skip doses or stop your medicine early. Talk to your pediatrician regarding the use of this medicine in children. Special care may be needed. Overdosage: If you think you have taken too much of this medicine contact a poison control center or emergency room at once. NOTE: This medicine is only for you. Do not share this medicine with others. What if I miss a dose? If you miss a dose, take it as soon as you can. If it is almost time for your next dose, take only that dose. Do not take double or extra doses. What may interact with this medicine? Do not take this medicine with any of the following medications:  certain medicines for fungal infections like fluconazole, itraconazole, ketoconazole, posaconazole, voriconazole  cisapride  dronedarone   naloxegol  pimozide  thioridazine This medicine may also interact with the following medications:  birth control pills  carbamazepine  certain medicines for anxiety or sleep like alprazolam, triazolam  certain medicines for cholesterol like atorvastatin, lovastatin, simvastatin  certain medicines for irregular heart beat like amiodarone, disopyramide, flecainide, procainamide, quinidine  certain medicines that treat or prevent clots like warfarin  colchicine  cyclosporine  digoxin  dofetilide  ergot alkaloids like ergotamine, dihydroergotamine  other antibiotics like grepafloxacin, rifabutin, sparfloxacin  other medicines that prolong the QT interval (cause an abnormal heart rhythm)  ritonavir  sildenafil  terfenadine  theophylline  zidovudine  ziprasidone This list may not describe all possible interactions. Give your health care provider a list of all the medicines, herbs, non-prescription drugs, or dietary supplements you use. Also tell them if you smoke, drink alcohol, or use illegal drugs. Some items may interact with your medicine. What should I watch for while using this medicine? Tell your doctor or health care provider if your symptoms do not improve. This medicine may cause serious skin reactions. They can happen weeks to months after starting the medicine. Contact your health care provider right away if you notice fevers or flu-like symptoms with a rash. The rash may be red or purple and then turn into blisters or peeling of the skin. Or, you might notice a red rash with swelling of the face, lips or lymph nodes in your neck or under your arms. Do not treat diarrhea with over the counter products. Contact your doctor if you have diarrhea that lasts more than 2 days  or if it is severe and watery. If you have diabetes, monitor your blood sugar carefully while on this medicine. What side effects may I notice from receiving this medicine? Side effects that  you should report to your doctor or health care professional as soon as possible:  allergic reactions like skin rash, itching or hives, swelling of the face, lips, or tongue  irregular heartbeat or chest pain  pain or difficulty passing urine  rash, fever, and swollen lymph nodes  redness, blistering, peeling or loosening of the skin, including inside the mouth  yellowing of the eyes or skin Side effects that usually do not require medical attention (report to your doctor or health care professional if they continue or are bothersome):  abnormal taste  anxiety, confusion, or nightmares  diarrhea  headache  intestinal gas  stomach upset or nausea This list may not describe all possible side effects. Call your doctor for medical advice about side effects. You may report side effects to FDA at 1-800-FDA-1088. Where should I keep my medicine? Keep out of the reach of children. Store at room temperature between 20 and 25 degrees C (68 and 77 degrees F). Keep container tightly closed. Protect from light. Throw away any unused medicine after the expiration date. NOTE: This sheet is a summary. It may not cover all possible information. If you have questions about this medicine, talk to your doctor, pharmacist, or health care provider.  2020 Elsevier/Gold Standard (2018-10-01 11:40:12) Upper Respiratory Infection, Adult An upper respiratory infection (URI) affects the nose, throat, and upper air passages. URIs are caused by germs (viruses). The most common type of URI is often called "the common cold." Medicines cannot cure URIs, but you can do things at home to relieve your symptoms. URIs usually get better within 7-10 days. Follow these instructions at home: Activity  Rest as needed.  If you have a fever, stay home from work or school until your fever is gone, or until your doctor says you may return to work or school. ? You should stay home until you cannot spread the infection  anymore (you are not contagious). ? Your doctor may have you wear a face mask so you have less risk of spreading the infection. Relieving symptoms  Gargle with a salt-water mixture 3-4 times a day or as needed. To make a salt-water mixture, completely dissolve -1 tsp of salt in 1 cup of warm water.  Use a cool-mist humidifier to add moisture to the air. This can help you breathe more easily. Eating and drinking   Drink enough fluid to keep your pee (urine) pale yellow.  Eat soups and other clear broths. General instructions   Take over-the-counter and prescription medicines only as told by your doctor. These include cold medicines, fever reducers, and cough suppressants.  Do not use any products that contain nicotine or tobacco. These include cigarettes and e-cigarettes. If you need help quitting, ask your doctor.  Avoid being where people are smoking (avoid secondhand smoke).  Make sure you get regular shots and get the flu shot every year.  Keep all follow-up visits as told by your doctor. This is important. How to avoid spreading infection to others   Wash your hands often with soap and water. If you do not have soap and water, use hand sanitizer.  Avoid touching your mouth, face, eyes, or nose.  Cough or sneeze into a tissue or your sleeve or elbow. Do not cough or sneeze into your hand or into  the air. Contact a doctor if:  You are getting worse, not better.  You have any of these: ? A fever. ? Chills. ? Brown or red mucus in your nose. ? Yellow or brown fluid (discharge)coming from your nose. ? Pain in your face, especially when you bend forward. ? Swollen neck glands. ? Pain with swallowing. ? White areas in the back of your throat. Get help right away if:  You have shortness of breath that gets worse.  You have very bad or constant: ? Headache. ? Ear pain. ? Pain in your forehead, behind your eyes, and over your cheekbones (sinus pain). ? Chest pain.   You have long-lasting (chronic) lung disease along with any of these: ? Wheezing. ? Long-lasting cough. ? Coughing up blood. ? A change in your usual mucus.  You have a stiff neck.  You have changes in your: ? Vision. ? Hearing. ? Thinking. ? Mood. Summary  An upper respiratory infection (URI) is caused by a germ called a virus. The most common type of URI is often called "the common cold."  URIs usually get better within 7-10 days.  Take over-the-counter and prescription medicines only as told by your doctor. This information is not intended to replace advice given to you by your health care provider. Make sure you discuss any questions you have with your health care provider. Document Released: 12/18/2007 Document Revised: 07/09/2018 Document Reviewed: 02/21/2017 Elsevier Patient Education  2020 [employer] [place],  You are being placed in the home monitoring program for COVID-19 (commonly known as Coronavirus).  This is because you are suspected to have the virus or are known to have the virus.  If you are unsure which group you fall into call your clinic.    As part of this program, you'll answer a daily questionnaire in the MyChart mobile app. You'll receive a notification through the MyChart app when the questionnaire is available. When you log in to MyChart, you'll see the tasks in your To Do activity.       Clinicians will see any answers that are concerning and take appropriate steps.  If at any point you are having a medical emergency, call 911.  If otherwise concerned call your clinic instead of coming into the clinic or hospital.  To keep from spreading the disease you should: Stay home and limit contact with other people as much as possible.  Wash your hands frequently. Cover your coughs and sneezes with a tissue, and throw used tissues in the trash.   Clean and disinfect frequently touched surfaces and objects.    Take care of yourself by: Staying home  Resting Drinking fluids Take fever-reducing medications (Tylenol/Acetaminophen and Ibuprofen)  For more information on the disease go to the Centers for Disease Control and Prevention website   COVID-19 Frequently Asked Questions COVID-19 (coronavirus disease) is an infection that is caused by a large family of viruses. Some viruses cause illness in people and others cause illness in animals like camels, cats, and bats. In some cases, the viruses that cause illness in animals can spread to humans. Where did the coronavirus come from? In December 2019, Armeniahina told the [employer] [facility](WHO) of several cases of lung disease (human respiratory illness). These cases were linked to an open seafood and [place]. The link to the seafood and livestock market suggests that the virus may have spread from animals to humans. However, since that first outbreak in December,  the virus has also been shown to spread from person to person. What is the name of the disease and the virus? Disease name Early on, this disease was called novel coronavirus. This is because scientists determined that the disease was caused by a new (novel) respiratory virus. The World Health Organization [facility]) has now named the disease COVID-19, or coronavirus disease. Virus name The virus that causes the disease is called severe acute respiratory syndrome coronavirus 2 (SARS-CoV-2). More information on disease and virus naming World Health Organization [facility]): www.who.int/emergencies/diseases/novel-coronavirus-2019/technical-guidance/naming-the-coronavirus-disease-(covid-2019)-and-the-virus-that-causes-it Who is at risk for complications from coronavirus disease? Some people may be at higher risk for complications from coronavirus disease. This includes older adults and people who have chronic diseases, such as heart disease, diabetes, and lung disease. If you are at higher risk for complications, take  these extra precautions:  Avoid close contact with people who are sick or have a fever or cough. Stay at least 3-6 ft (1-2 m) away from them, if possible.  Wash your hands often with soap and water for at least 20 seconds.  Avoid touching your face, mouth, nose, or eyes.  Keep supplies on hand at home, such as food, medicine, and cleaning supplies.  Stay home as much as possible.  Avoid social gatherings and travel. How does coronavirus disease spread? The virus that causes coronavirus disease spreads easily from person to person (is contagious). There are also cases of community-spread disease. This means the disease has spread to:  People who have no known contact with other infected people.  People who have not traveled to areas where there are known cases. It appears to spread from one person to another through droplets from coughing or sneezing. Can I get the virus from touching surfaces or objects? There is still a lot that we do not know about the virus that causes coronavirus disease. Scientists are basing a lot of information on what they know about similar viruses, such as:  Viruses cannot generally survive on surfaces for long. They need a human body (host) to survive.  It is more likely that the virus is spread by close contact with people who are sick (direct contact), such as through: ? Shaking hands or hugging. ? Breathing in respiratory droplets that travel through the air. This can happen when an infected person coughs or sneezes on or near other people.  It is less likely that the virus is spread when a person touches a surface or object that has the virus on it (indirect contact). The virus may be able to enter the body if the person touches a surface or object and then touches his or her face, eyes, nose, or mouth. Can a person spread the virus without having symptoms of the disease? It may be possible for the virus to spread before a person has symptoms of the  disease, but this is most likely not the main way the virus is spreading. It is more likely for the virus to spread by being in close contact with people who are sick and breathing in the respiratory droplets of a sick person's cough or sneeze. What are the symptoms of coronavirus disease? Symptoms vary from person to person and can range from mild to severe. Symptoms may include:  Fever.  Cough.  Tiredness, weakness, or fatigue.  Fast breathing or feeling short of breath. These symptoms can appear anywhere from 2 to 14 days after you have been exposed to the virus. If you develop symptoms, call your health care  provider. People with severe symptoms may need hospital care. If I am exposed to the virus, how long does it take before symptoms start? Symptoms of coronavirus disease may appear anywhere from 2 to 14 days after a person has been exposed to the virus. If you develop symptoms, call your health care provider. Should I be tested for this virus? Your health care provider will decide whether to test you based on your symptoms, history of exposure, and your risk factors. How does a health care provider test for this virus? Health care providers will collect samples to send for testing. Samples may include:  Taking a swab of fluid from the nose.  Taking fluid from the lungs by having you cough up mucus (sputum) into a sterile cup.  Taking a blood sample.  Taking a stool or urine sample. Is there a treatment or vaccine for this virus? Currently, there is no vaccine to prevent coronavirus disease. Also, there are no medicines like antibiotics or antivirals to treat the virus. A person who becomes sick is given supportive care, which means rest and fluids. A person may also relieve his or her symptoms by using over-the-counter medicines that treat sneezing, coughing, and runny nose. These are the same medicines that a person takes for the common cold. If you develop symptoms, call your  health care provider. People with severe symptoms may need hospital care. What can I do to protect myself and my family from this virus?     You can protect yourself and your family by taking the same actions that you would take to prevent the spread of other viruses. Take the following actions:  Wash your hands often with soap and water for at least 20 seconds. If soap and water are not available, use alcohol-based hand sanitizer.  Avoid touching your face, mouth, nose, or eyes.  Cough or sneeze into a tissue, sleeve, or elbow. Do not cough or sneeze into your hand or the air. ? If you cough or sneeze into a tissue, throw it away immediately and wash your hands.  Disinfect objects and surfaces that you frequently touch every day.  Avoid close contact with people who are sick or have a fever or cough. Stay at least 3-6 ft (1-2 m) away from them, if possible.  Stay home if you are sick, except to get medical care. Call your health care provider before you get medical care.  Make sure your vaccines are up to date. Ask your health care provider what vaccines you need. What should I do if I need to travel? Follow travel recommendations from your local health authority, the CDC, and WHO. Travel information and advice  Centers for Disease Control and Prevention (CDC): GeminiCard.gl  World Health Organization [facility]): PreviewDomains.se Know the risks and take action to protect your health  You are at higher risk of getting coronavirus disease if you are traveling to areas with an outbreak or if you are exposed to travelers from areas with an outbreak.  Wash your hands often and practice good hygiene to lower the risk of catching or spreading the virus. What should I do if I am sick? General instructions to stop the spread of infection  Wash your hands often with soap and water for at least 20  seconds. If soap and water are not available, use alcohol-based hand sanitizer.  Cough or sneeze into a tissue, sleeve, or elbow. Do not cough or sneeze into your hand or the air.  If you cough  or sneeze into a tissue, throw it away immediately and wash your hands.  Stay home unless you must get medical care. Call your health care provider or local health authority before you get medical care.  Avoid public areas. Do not take public transportation, if possible.  If you can, wear a mask if you must go out of the house or if you are in close contact with someone who is not sick. Keep your home clean  Disinfect objects and surfaces that are frequently touched every day. This may include: ? Counters and tables. ? Doorknobs and light switches. ? Sinks and faucets. ? Electronics such as phones, remote controls, keyboards, computers, and tablets.  Wash dishes in hot, soapy water or use a dishwasher. Air-dry your dishes.  Wash laundry in hot water. Prevent infecting other household members  Let healthy household members care for children and pets, if possible. If you have to care for children or pets, wash your hands often and wear a mask.  Sleep in a different bedroom or bed, if possible.  Do not share personal items, such as razors, toothbrushes, deodorant, combs, brushes, towels, and washcloths. Where to find more information Centers for Disease Control and Prevention (CDC)  Information and news updates: CardRetirement.czwww.cdc.gov/coronavirus/2019-ncov World Health Organization [facility](WHO)  Information and news updates: AffordableSalon.eswww.who.int/emergencies/diseases/novel-coronavirus-2019  Coronavirus health topic: https://thompson-craig.com/www.who.int/health-topics/coronavirus  Questions and answers on COVID-19: kruiseway.comwww.who.int/news-room/q-a-detail/q-a-coronaviruses  Global tracker: who.sprinklr.com American Academy of Pediatrics (AAP)  Information for families:  www.healthychildren.org/English/health-issues/conditions/chest-lungs/Pages/2019-Novel-Coronavirus.aspx The coronavirus situation is changing rapidly. Check your local health authority website or the CDC and [facility] websites for updates and news. When should I contact a health care provider?  Contact your health care provider if you have symptoms of an infection, such as fever or cough, and you: ? Have been near anyone who is known to have coronavirus disease. ? Have come into contact with a person who is suspected to have coronavirus disease. ? Have traveled outside of the country. When should I get emergency medical care?  Get help right away by calling your local emergency services (911 in the [place] if you have: ? Trouble breathing. ? Pain or pressure in your chest. ? Confusion. ? Blue-tinged lips and fingernails. ? Difficulty waking from sleep. ? Symptoms that get worse. Let the emergency medical personnel know if you think you have coronavirus disease. Summary  A new respiratory virus is spreading from person to person and causing COVID-19 (coronavirus disease).  The virus that causes COVID-19 appears to spread easily. It spreads from one person to another through droplets from coughing or sneezing.  Older adults and those with chronic diseases are at higher risk of disease. If you are at higher risk for complications, take extra precautions.  There is currently no vaccine to prevent coronavirus disease. There are no medicines, such as antibiotics or antivirals, to treat the virus.  You can protect yourself and your family by washing your hands often, avoiding touching your face, and covering your coughs and sneezes. This information is not intended to replace advice given to you by your health care provider. Make sure you discuss any questions you have with your health care provider. Document Released: 10/27/2018 Document Revised: 10/27/2018 Document Reviewed: 10/27/2018 Elsevier  Patient Education  2020 [employer]

## 2019-02-01 NOTE — Progress Notes (Signed)
Virtual Visit via Telephone Note  I connected with Krystal Acosta on 02/01/19 at 10:30 AM EDT by telephone and verified that I am speaking with the correct person using two identifiers.  Location: Patient: at work Provider: [facility], [place], [place]    I discussed the limitations, risks, security and privacy concerns of performing an evaluation and management service by telephone and the availability of in person appointments. I also discussed with the patient that there may be a patient responsible charge related to this service. The patient expressed understanding and agreed to proceed.   History of Present Illness: Patient is a 43 year old female in no acute distress who calls the clinic with runny nose, congested nasal passages, throat pain.  Clear sputum.  Mild fatigue. 100.2 temperature.  Patient  denies any  body aches,chills, rash, chest pain, shortness of breath, nausea, vomiting, or diarrhea.     Observations/Objective:   Patient is alert and oriented and responsive to questions Engages in conversation with provider. Speaks in full sentences without any pauses without any shortness of breath or distress.   Assessment and Plan: 1. Acute upper respiratory infection   2. Sore throat   3. Nasal congestion   4. Suspected Covid-19 Virus Infection    Meds ordered this encounter  Medications  . clarithromycin (BIAXIN) 500 MG tablet    Sig: Take 1 tablet (500 mg total) by mouth 2 (two) times daily.    Dispense:  14 tablet    Refill:  0    Follow Up Instructions: Discussed RED FLAG SYMPTOMS and when to seek emergency medical care.    Advised patient call the office or your primary care doctor for an appointment if no improvement within 72 hours or if any symptoms change or worsen at any time  Advised ER or urgent Care if after hours or on weekend. Call 911 for emergency symptoms at any time.Patinet verbalized understanding of all  instructions given/reviewed and treatment plan and has no further questions or concerns at this time.     I discussed the assessment and treatment plan with the patient. The patient was provided an opportunity to ask questions and all were answered. The patient agreed with the plan and demonstrated an understanding of the instructions.   The patient was advised to call back or seek an in-person evaluation if the symptoms worsen or if the condition fails to improve as anticipated.  I provided 15  minutes of non-face-to-face time during this encounter.   Marcille Buffy, FNP
